# Patient Record
Sex: Male | Born: 1945 | Race: Black or African American | Hispanic: No | Marital: Single | Smoking: Former smoker
Health system: Southern US, Community
[De-identification: ages and names within clinical notes are randomized; demographics above are authoritative.]

## PROBLEM LIST (undated history)

## (undated) DIAGNOSIS — I872 Venous insufficiency (chronic) (peripheral): Secondary | ICD-10-CM

## (undated) DIAGNOSIS — G709 Myoneural disorder, unspecified: Secondary | ICD-10-CM

## (undated) DIAGNOSIS — E119 Type 2 diabetes mellitus without complications: Secondary | ICD-10-CM

## (undated) DIAGNOSIS — H269 Unspecified cataract: Secondary | ICD-10-CM

## (undated) DIAGNOSIS — E785 Hyperlipidemia, unspecified: Secondary | ICD-10-CM

## (undated) HISTORY — DX: Unspecified cataract: H26.9

## (undated) HISTORY — DX: Venous insufficiency (chronic) (peripheral): I87.2

## (undated) HISTORY — DX: Myoneural disorder, unspecified: G70.9

## (undated) HISTORY — DX: Hyperlipidemia, unspecified: E78.5

## (undated) HISTORY — DX: Type 2 diabetes mellitus without complications: E11.9

## (undated) HISTORY — PX: CARDIAC CATHETERIZATION: SHX172

---

## 1974-04-14 HISTORY — PX: INGUINAL HERNIA REPAIR: SUR1180

## 2001-02-05 ENCOUNTER — Ambulatory Visit (HOSPITAL_COMMUNITY): Admission: RE | Admit: 2001-02-05 | Discharge: 2001-02-05 | Payer: Self-pay | Admitting: *Deleted

## 2011-09-17 DIAGNOSIS — E119 Type 2 diabetes mellitus without complications: Secondary | ICD-10-CM | POA: Diagnosis not present

## 2011-09-17 DIAGNOSIS — R6882 Decreased libido: Secondary | ICD-10-CM | POA: Diagnosis not present

## 2011-11-05 DIAGNOSIS — E785 Hyperlipidemia, unspecified: Secondary | ICD-10-CM | POA: Diagnosis not present

## 2011-11-05 DIAGNOSIS — E291 Testicular hypofunction: Secondary | ICD-10-CM | POA: Diagnosis not present

## 2011-11-05 DIAGNOSIS — E119 Type 2 diabetes mellitus without complications: Secondary | ICD-10-CM | POA: Diagnosis not present

## 2011-11-05 DIAGNOSIS — R6882 Decreased libido: Secondary | ICD-10-CM | POA: Diagnosis not present

## 2011-12-16 DIAGNOSIS — E119 Type 2 diabetes mellitus without complications: Secondary | ICD-10-CM | POA: Diagnosis not present

## 2011-12-16 DIAGNOSIS — L259 Unspecified contact dermatitis, unspecified cause: Secondary | ICD-10-CM | POA: Diagnosis not present

## 2011-12-16 DIAGNOSIS — E291 Testicular hypofunction: Secondary | ICD-10-CM | POA: Diagnosis not present

## 2012-03-30 DIAGNOSIS — Z23 Encounter for immunization: Secondary | ICD-10-CM | POA: Diagnosis not present

## 2012-03-30 DIAGNOSIS — E119 Type 2 diabetes mellitus without complications: Secondary | ICD-10-CM | POA: Diagnosis not present

## 2012-03-30 DIAGNOSIS — R609 Edema, unspecified: Secondary | ICD-10-CM | POA: Diagnosis not present

## 2012-03-30 DIAGNOSIS — E785 Hyperlipidemia, unspecified: Secondary | ICD-10-CM | POA: Diagnosis not present

## 2012-03-30 DIAGNOSIS — E291 Testicular hypofunction: Secondary | ICD-10-CM | POA: Diagnosis not present

## 2012-04-20 DIAGNOSIS — E291 Testicular hypofunction: Secondary | ICD-10-CM | POA: Diagnosis not present

## 2012-10-11 DIAGNOSIS — I872 Venous insufficiency (chronic) (peripheral): Secondary | ICD-10-CM | POA: Insufficient documentation

## 2012-10-11 DIAGNOSIS — E119 Type 2 diabetes mellitus without complications: Secondary | ICD-10-CM | POA: Diagnosis not present

## 2012-12-16 DIAGNOSIS — Z23 Encounter for immunization: Secondary | ICD-10-CM | POA: Diagnosis not present

## 2013-01-28 DIAGNOSIS — E291 Testicular hypofunction: Secondary | ICD-10-CM | POA: Diagnosis not present

## 2013-01-28 DIAGNOSIS — K5900 Constipation, unspecified: Secondary | ICD-10-CM | POA: Insufficient documentation

## 2013-01-28 DIAGNOSIS — I872 Venous insufficiency (chronic) (peripheral): Secondary | ICD-10-CM | POA: Diagnosis not present

## 2013-01-28 DIAGNOSIS — Z6831 Body mass index (BMI) 31.0-31.9, adult: Secondary | ICD-10-CM | POA: Diagnosis not present

## 2013-01-28 DIAGNOSIS — E1165 Type 2 diabetes mellitus with hyperglycemia: Secondary | ICD-10-CM | POA: Diagnosis not present

## 2013-01-28 DIAGNOSIS — Z1331 Encounter for screening for depression: Secondary | ICD-10-CM | POA: Diagnosis not present

## 2013-01-28 DIAGNOSIS — E785 Hyperlipidemia, unspecified: Secondary | ICD-10-CM | POA: Diagnosis not present

## 2013-02-25 ENCOUNTER — Encounter: Payer: Self-pay | Admitting: Cardiology

## 2013-06-15 DIAGNOSIS — E291 Testicular hypofunction: Secondary | ICD-10-CM | POA: Diagnosis not present

## 2013-06-15 DIAGNOSIS — Z6832 Body mass index (BMI) 32.0-32.9, adult: Secondary | ICD-10-CM | POA: Diagnosis not present

## 2013-06-15 DIAGNOSIS — K5900 Constipation, unspecified: Secondary | ICD-10-CM | POA: Diagnosis not present

## 2013-06-15 DIAGNOSIS — E785 Hyperlipidemia, unspecified: Secondary | ICD-10-CM | POA: Diagnosis not present

## 2013-06-15 DIAGNOSIS — E1165 Type 2 diabetes mellitus with hyperglycemia: Secondary | ICD-10-CM | POA: Diagnosis not present

## 2013-06-15 DIAGNOSIS — IMO0002 Reserved for concepts with insufficient information to code with codable children: Secondary | ICD-10-CM | POA: Diagnosis not present

## 2013-08-10 DIAGNOSIS — E291 Testicular hypofunction: Secondary | ICD-10-CM | POA: Diagnosis not present

## 2013-08-10 DIAGNOSIS — E785 Hyperlipidemia, unspecified: Secondary | ICD-10-CM | POA: Diagnosis not present

## 2013-08-10 DIAGNOSIS — IMO0002 Reserved for concepts with insufficient information to code with codable children: Secondary | ICD-10-CM | POA: Diagnosis not present

## 2013-08-10 DIAGNOSIS — Z6831 Body mass index (BMI) 31.0-31.9, adult: Secondary | ICD-10-CM | POA: Diagnosis not present

## 2013-08-10 DIAGNOSIS — E1165 Type 2 diabetes mellitus with hyperglycemia: Secondary | ICD-10-CM | POA: Diagnosis not present

## 2013-10-05 DIAGNOSIS — E1165 Type 2 diabetes mellitus with hyperglycemia: Secondary | ICD-10-CM | POA: Diagnosis not present

## 2013-10-05 DIAGNOSIS — I872 Venous insufficiency (chronic) (peripheral): Secondary | ICD-10-CM | POA: Diagnosis not present

## 2013-10-05 DIAGNOSIS — Z6831 Body mass index (BMI) 31.0-31.9, adult: Secondary | ICD-10-CM | POA: Diagnosis not present

## 2013-10-05 DIAGNOSIS — IMO0001 Reserved for inherently not codable concepts without codable children: Secondary | ICD-10-CM | POA: Diagnosis not present

## 2013-10-05 DIAGNOSIS — IMO0002 Reserved for concepts with insufficient information to code with codable children: Secondary | ICD-10-CM | POA: Diagnosis not present

## 2013-10-05 DIAGNOSIS — E785 Hyperlipidemia, unspecified: Secondary | ICD-10-CM | POA: Diagnosis not present

## 2013-10-05 DIAGNOSIS — E291 Testicular hypofunction: Secondary | ICD-10-CM | POA: Diagnosis not present

## 2013-11-03 DIAGNOSIS — Z6832 Body mass index (BMI) 32.0-32.9, adult: Secondary | ICD-10-CM | POA: Diagnosis not present

## 2013-11-03 DIAGNOSIS — IMO0001 Reserved for inherently not codable concepts without codable children: Secondary | ICD-10-CM | POA: Diagnosis not present

## 2013-11-16 DIAGNOSIS — Z6831 Body mass index (BMI) 31.0-31.9, adult: Secondary | ICD-10-CM | POA: Diagnosis not present

## 2013-11-16 DIAGNOSIS — E291 Testicular hypofunction: Secondary | ICD-10-CM | POA: Diagnosis not present

## 2013-11-16 DIAGNOSIS — IMO0001 Reserved for inherently not codable concepts without codable children: Secondary | ICD-10-CM | POA: Diagnosis not present

## 2013-11-16 DIAGNOSIS — E785 Hyperlipidemia, unspecified: Secondary | ICD-10-CM | POA: Diagnosis not present

## 2013-11-16 DIAGNOSIS — I872 Venous insufficiency (chronic) (peripheral): Secondary | ICD-10-CM | POA: Diagnosis not present

## 2013-12-14 DIAGNOSIS — Z6833 Body mass index (BMI) 33.0-33.9, adult: Secondary | ICD-10-CM | POA: Diagnosis not present

## 2013-12-14 DIAGNOSIS — IMO0001 Reserved for inherently not codable concepts without codable children: Secondary | ICD-10-CM | POA: Diagnosis not present

## 2014-01-11 DIAGNOSIS — I872 Venous insufficiency (chronic) (peripheral): Secondary | ICD-10-CM | POA: Diagnosis not present

## 2014-01-11 DIAGNOSIS — IMO0001 Reserved for inherently not codable concepts without codable children: Secondary | ICD-10-CM | POA: Diagnosis not present

## 2014-01-11 DIAGNOSIS — Z23 Encounter for immunization: Secondary | ICD-10-CM | POA: Diagnosis not present

## 2014-01-11 DIAGNOSIS — Z6833 Body mass index (BMI) 33.0-33.9, adult: Secondary | ICD-10-CM | POA: Diagnosis not present

## 2014-01-11 DIAGNOSIS — E291 Testicular hypofunction: Secondary | ICD-10-CM | POA: Diagnosis not present

## 2014-01-11 DIAGNOSIS — E785 Hyperlipidemia, unspecified: Secondary | ICD-10-CM | POA: Diagnosis not present

## 2014-02-08 DIAGNOSIS — E1165 Type 2 diabetes mellitus with hyperglycemia: Secondary | ICD-10-CM | POA: Diagnosis not present

## 2014-02-08 DIAGNOSIS — Z6833 Body mass index (BMI) 33.0-33.9, adult: Secondary | ICD-10-CM | POA: Diagnosis not present

## 2014-05-03 DIAGNOSIS — Z6833 Body mass index (BMI) 33.0-33.9, adult: Secondary | ICD-10-CM | POA: Diagnosis not present

## 2014-05-03 DIAGNOSIS — E785 Hyperlipidemia, unspecified: Secondary | ICD-10-CM | POA: Diagnosis not present

## 2014-05-03 DIAGNOSIS — E119 Type 2 diabetes mellitus without complications: Secondary | ICD-10-CM | POA: Diagnosis not present

## 2014-05-03 DIAGNOSIS — I872 Venous insufficiency (chronic) (peripheral): Secondary | ICD-10-CM | POA: Diagnosis not present

## 2014-05-03 DIAGNOSIS — E291 Testicular hypofunction: Secondary | ICD-10-CM | POA: Diagnosis not present

## 2014-05-03 DIAGNOSIS — K5900 Constipation, unspecified: Secondary | ICD-10-CM | POA: Diagnosis not present

## 2014-07-04 DIAGNOSIS — Z6833 Body mass index (BMI) 33.0-33.9, adult: Secondary | ICD-10-CM | POA: Diagnosis not present

## 2014-07-04 DIAGNOSIS — E119 Type 2 diabetes mellitus without complications: Secondary | ICD-10-CM | POA: Diagnosis not present

## 2014-09-05 DIAGNOSIS — E785 Hyperlipidemia, unspecified: Secondary | ICD-10-CM | POA: Diagnosis not present

## 2014-09-05 DIAGNOSIS — E119 Type 2 diabetes mellitus without complications: Secondary | ICD-10-CM | POA: Diagnosis not present

## 2014-09-05 DIAGNOSIS — R600 Localized edema: Secondary | ICD-10-CM | POA: Diagnosis not present

## 2014-09-05 DIAGNOSIS — Z6834 Body mass index (BMI) 34.0-34.9, adult: Secondary | ICD-10-CM | POA: Diagnosis not present

## 2014-09-05 DIAGNOSIS — E291 Testicular hypofunction: Secondary | ICD-10-CM | POA: Diagnosis not present

## 2015-01-15 DIAGNOSIS — E291 Testicular hypofunction: Secondary | ICD-10-CM | POA: Diagnosis not present

## 2015-01-15 DIAGNOSIS — I872 Venous insufficiency (chronic) (peripheral): Secondary | ICD-10-CM | POA: Diagnosis not present

## 2015-01-15 DIAGNOSIS — E785 Hyperlipidemia, unspecified: Secondary | ICD-10-CM | POA: Diagnosis not present

## 2015-01-15 DIAGNOSIS — Z6834 Body mass index (BMI) 34.0-34.9, adult: Secondary | ICD-10-CM | POA: Diagnosis not present

## 2015-01-15 DIAGNOSIS — E119 Type 2 diabetes mellitus without complications: Secondary | ICD-10-CM | POA: Diagnosis not present

## 2015-01-15 DIAGNOSIS — Z23 Encounter for immunization: Secondary | ICD-10-CM | POA: Diagnosis not present

## 2015-05-28 DIAGNOSIS — I872 Venous insufficiency (chronic) (peripheral): Secondary | ICD-10-CM | POA: Diagnosis not present

## 2015-05-28 DIAGNOSIS — Z6832 Body mass index (BMI) 32.0-32.9, adult: Secondary | ICD-10-CM | POA: Diagnosis not present

## 2015-05-28 DIAGNOSIS — Z1389 Encounter for screening for other disorder: Secondary | ICD-10-CM | POA: Diagnosis not present

## 2015-05-28 DIAGNOSIS — E784 Other hyperlipidemia: Secondary | ICD-10-CM | POA: Diagnosis not present

## 2015-05-28 DIAGNOSIS — E119 Type 2 diabetes mellitus without complications: Secondary | ICD-10-CM | POA: Diagnosis not present

## 2015-05-28 DIAGNOSIS — E298 Other testicular dysfunction: Secondary | ICD-10-CM | POA: Diagnosis not present

## 2015-06-01 ENCOUNTER — Encounter: Payer: Self-pay | Admitting: Gastroenterology

## 2015-07-19 DIAGNOSIS — H2513 Age-related nuclear cataract, bilateral: Secondary | ICD-10-CM | POA: Diagnosis not present

## 2015-07-19 DIAGNOSIS — H40033 Anatomical narrow angle, bilateral: Secondary | ICD-10-CM | POA: Diagnosis not present

## 2015-08-03 ENCOUNTER — Encounter: Payer: Self-pay | Admitting: Gastroenterology

## 2015-08-27 ENCOUNTER — Ambulatory Visit (AMBULATORY_SURGERY_CENTER): Payer: Self-pay | Admitting: *Deleted

## 2015-08-27 VITALS — Ht 73.0 in | Wt 232.8 lb

## 2015-08-27 DIAGNOSIS — Z1211 Encounter for screening for malignant neoplasm of colon: Secondary | ICD-10-CM

## 2015-08-27 NOTE — Progress Notes (Signed)
No egg or soy allergy known to patient  No issues with past sedation with any surgeries  or procedures, no intubation problems  No diet pills per patient No home 02 use per patient  No blood thinners per patient  Pt denies issues with constipation - pt states he has occasionally  had to take a stool softener for constipation but not weekly or chronic

## 2015-09-07 ENCOUNTER — Ambulatory Visit (AMBULATORY_SURGERY_CENTER): Payer: Medicare Other | Admitting: Gastroenterology

## 2015-09-07 ENCOUNTER — Encounter: Payer: Self-pay | Admitting: Gastroenterology

## 2015-09-07 VITALS — BP 110/63 | HR 55 | Temp 98.0°F | Resp 15 | Ht 73.0 in | Wt 232.0 lb

## 2015-09-07 DIAGNOSIS — Z1211 Encounter for screening for malignant neoplasm of colon: Secondary | ICD-10-CM | POA: Diagnosis not present

## 2015-09-07 DIAGNOSIS — Z122 Encounter for screening for malignant neoplasm of respiratory organs: Secondary | ICD-10-CM

## 2015-09-07 DIAGNOSIS — N319 Neuromuscular dysfunction of bladder, unspecified: Secondary | ICD-10-CM | POA: Diagnosis not present

## 2015-09-07 DIAGNOSIS — E119 Type 2 diabetes mellitus without complications: Secondary | ICD-10-CM | POA: Diagnosis not present

## 2015-09-07 DIAGNOSIS — K648 Other hemorrhoids: Secondary | ICD-10-CM | POA: Diagnosis not present

## 2015-09-07 MED ORDER — SODIUM CHLORIDE 0.9 % IV SOLN: 500.0000 mL | INTRAVENOUS | Status: DC

## 2015-09-07 NOTE — Progress Notes (Signed)
Stable to RR 

## 2015-09-07 NOTE — Op Note (Signed)
[facility] Patient Name: Derrick Ward Procedure Date: 09/07/2015 9:29 AM MRN: [id number] Endoscopist: Sherilyn Cooter L. Myrtie Neither , MD Age: 70 Referring MD:  Date of Birth: May 30, 1945 Gender: Male Procedure:                Colonoscopy Indications:              Screening for colorectal malignant neoplasm, This                            is the patient's first colonoscopy Medicines:                Monitored Anesthesia Care Procedure:                Pre-Anesthesia Assessment:                           - Prior to the procedure, a History and Physical                            was performed, and patient medications and                            allergies were reviewed. The patient's tolerance of                            previous anesthesia was also reviewed. The risks                            and benefits of the procedure and the sedation                            options and risks were discussed with the patient.                            All questions were answered, and informed consent                            was obtained. Prior Anticoagulants: The patient has                            taken no previous anticoagulant or antiplatelet                            agents. ASA Grade Assessment: II - A patient with                            mild systemic disease. After reviewing the risks                            and benefits, the patient was deemed in                            satisfactory condition to undergo the procedure.  After obtaining informed consent, the colonoscope                            was passed under direct vision. Throughout the                            procedure, the patient's blood pressure, pulse, and                            oxygen saturations were monitored continuously. The                            Model CF-HQ190L [id number]) scope was introduced                            through the anus and advanced to the the cecum,                             identified by appendiceal orifice and ileocecal                            valve. The colonoscopy was performed without                            difficulty. The patient tolerated the procedure                            well. The quality of the bowel preparation was                            good. The ileocecal valve, appendiceal orifice, and                            rectum were photographed. Scope In: 9:36:00 AM Scope Out: 9:53:12 AM Scope Withdrawal Time: 0 hours 10 minutes 19 seconds  Total Procedure Duration: 0 hours 17 minutes 12 seconds  Findings:                 The perianal and digital rectal examinations were                            normal.                           Internal hemorrhoids were found during                            retroflexion. The hemorrhoids were Grade I                            (internal hemorrhoids that do not prolapse).                           The exam was otherwise without abnormality. Complications:            No immediate complications. Estimated  Blood Loss:     Estimated blood loss: none. Impression:               - Internal hemorrhoids.                           - The examination was otherwise normal.                           - No specimens collected. Recommendation:           - Patient has a contact number available for                            emergencies. The signs and symptoms of potential                            delayed complications were discussed with the                            patient. Return to normal activities tomorrow.                            Written discharge instructions were provided to the                            patient.                           - Resume previous diet.                           - Continue present medications.                           - Repeat colonoscopy in 10 years for screening                            purposes if patient's overall health allows. Kelby Lotspeich L.  Myrtie Neither, MD 09/07/2015 9:58:57 AM This report has been signed electronically.

## 2015-09-07 NOTE — Patient Instructions (Signed)
YOU HAD AN ENDOSCOPIC PROCEDURE TODAY AT THE [facility]:   Refer to the procedure report that was given to you for any specific questions about what was found during the examination.  If the procedure report does not answer your questions, please call your gastroenterologist to clarify.  If you requested that your care partner not be given the details of your procedure findings, then the procedure report has been included in a sealed envelope for you to review at your convenience later.  YOU SHOULD EXPECT: Some feelings of bloating in the abdomen. Passage of more gas than usual.  Walking can help get rid of the air that was put into your GI tract during the procedure and reduce the bloating. If you had a lower endoscopy (such as a colonoscopy or flexible sigmoidoscopy) you may notice spotting of blood in your stool or on the toilet paper. If you underwent a bowel prep for your procedure, you may not have a normal bowel movement for a few days.  Please Note:  You might notice some irritation and congestion in your nose or some drainage.  This is from the oxygen used during your procedure.  There is no need for concern and it should clear up in a day or so.  SYMPTOMS TO REPORT IMMEDIATELY:   Following lower endoscopy (colonoscopy or flexible sigmoidoscopy):  Excessive amounts of blood in the stool  Significant tenderness or worsening of abdominal pains  Swelling of the abdomen that is new, acute  Fever of 100F or higher   For urgent or emergent issues, a gastroenterologist can be reached at any hour by calling [phone].   DIET: Your first meal following the procedure should be a small meal and then it is ok to progress to your normal diet. Heavy or fried foods are harder to digest and may make you feel nauseous or bloated.  Likewise, meals heavy in dairy and vegetables can increase bloating.  Drink plenty of fluids but you should avoid alcoholic beverages for 24  hours.  ACTIVITY:  You should plan to take it easy for the rest of today and you should NOT DRIVE or use heavy machinery until tomorrow (because of the sedation medicines used during the test).    FOLLOW UP: Our staff will call the number listed on your records the next business day following your procedure to check on you and address any questions or concerns that you may have regarding the information given to you following your procedure. If we do not reach you, we will leave a message.  However, if you are feeling well and you are not experiencing any problems, there is no need to return our call.  We will assume that you have returned to your regular daily activities without incident.  If any biopsies were taken you will be contacted by phone or by letter within the next 1-3 weeks.  Please call us at [phone] if you have not heard about the biopsies in 3 weeks.    SIGNATURES/CONFIDENTIALITY: You and/or your care partner have signed paperwork which will be entered into your electronic medical record.  These signatures attest to the fact that that the information above on your After Visit Summary has been reviewed and is understood.  Full responsibility of the confidentiality of this discharge information lies with you and/or your care-partner.  Hemorrhoids-handouts given  Repeat colonoscopy in 10 years 2027.

## 2015-09-11 ENCOUNTER — Telehealth: Payer: Self-pay

## 2015-09-11 NOTE — Telephone Encounter (Signed)
  Follow up Call-  Call back number 09/07/2015  Post procedure Call Back phone  # [phone]  Permission to leave phone message Yes     Patient questions:  Do you have a fever, pain , or abdominal swelling? No. Pain Score  0 *  Have you tolerated food without any problems? Yes.    Have you been able to return to your normal activities? Yes.    Do you have any questions about your discharge instructions: Diet   No. Medications  No. Follow up visit  No.  Do you have questions or concerns about your Care? No.  Actions: * If pain score is 4 or above: No action needed, pain <4.

## 2015-10-08 DIAGNOSIS — E298 Other testicular dysfunction: Secondary | ICD-10-CM | POA: Diagnosis not present

## 2015-10-08 DIAGNOSIS — I872 Venous insufficiency (chronic) (peripheral): Secondary | ICD-10-CM | POA: Diagnosis not present

## 2015-10-08 DIAGNOSIS — E784 Other hyperlipidemia: Secondary | ICD-10-CM | POA: Diagnosis not present

## 2015-10-08 DIAGNOSIS — Z1389 Encounter for screening for other disorder: Secondary | ICD-10-CM | POA: Diagnosis not present

## 2015-10-08 DIAGNOSIS — E119 Type 2 diabetes mellitus without complications: Secondary | ICD-10-CM | POA: Diagnosis not present

## 2015-10-08 DIAGNOSIS — Z6832 Body mass index (BMI) 32.0-32.9, adult: Secondary | ICD-10-CM | POA: Diagnosis not present

## 2015-11-08 DIAGNOSIS — Z6832 Body mass index (BMI) 32.0-32.9, adult: Secondary | ICD-10-CM | POA: Diagnosis not present

## 2015-11-08 DIAGNOSIS — E119 Type 2 diabetes mellitus without complications: Secondary | ICD-10-CM | POA: Diagnosis not present

## 2016-02-12 DIAGNOSIS — E784 Other hyperlipidemia: Secondary | ICD-10-CM | POA: Diagnosis not present

## 2016-02-12 DIAGNOSIS — Z6832 Body mass index (BMI) 32.0-32.9, adult: Secondary | ICD-10-CM | POA: Diagnosis not present

## 2016-02-12 DIAGNOSIS — I872 Venous insufficiency (chronic) (peripheral): Secondary | ICD-10-CM | POA: Diagnosis not present

## 2016-02-12 DIAGNOSIS — E291 Testicular hypofunction: Secondary | ICD-10-CM | POA: Diagnosis not present

## 2016-02-12 DIAGNOSIS — Z23 Encounter for immunization: Secondary | ICD-10-CM | POA: Diagnosis not present

## 2016-02-12 DIAGNOSIS — E119 Type 2 diabetes mellitus without complications: Secondary | ICD-10-CM | POA: Diagnosis not present

## 2016-04-30 DIAGNOSIS — Z6832 Body mass index (BMI) 32.0-32.9, adult: Secondary | ICD-10-CM | POA: Diagnosis not present

## 2016-04-30 DIAGNOSIS — E784 Other hyperlipidemia: Secondary | ICD-10-CM | POA: Diagnosis not present

## 2016-04-30 DIAGNOSIS — R600 Localized edema: Secondary | ICD-10-CM | POA: Diagnosis not present

## 2016-04-30 DIAGNOSIS — E119 Type 2 diabetes mellitus without complications: Secondary | ICD-10-CM | POA: Diagnosis not present

## 2016-07-09 DIAGNOSIS — Z1389 Encounter for screening for other disorder: Secondary | ICD-10-CM | POA: Diagnosis not present

## 2016-07-09 DIAGNOSIS — I872 Venous insufficiency (chronic) (peripheral): Secondary | ICD-10-CM | POA: Diagnosis not present

## 2016-07-09 DIAGNOSIS — E119 Type 2 diabetes mellitus without complications: Secondary | ICD-10-CM | POA: Diagnosis not present

## 2016-07-09 DIAGNOSIS — Z6832 Body mass index (BMI) 32.0-32.9, adult: Secondary | ICD-10-CM | POA: Diagnosis not present

## 2016-07-09 DIAGNOSIS — E784 Other hyperlipidemia: Secondary | ICD-10-CM | POA: Diagnosis not present

## 2016-10-03 DIAGNOSIS — E784 Other hyperlipidemia: Secondary | ICD-10-CM | POA: Diagnosis not present

## 2016-10-03 DIAGNOSIS — E119 Type 2 diabetes mellitus without complications: Secondary | ICD-10-CM | POA: Diagnosis not present

## 2016-10-03 DIAGNOSIS — Z6831 Body mass index (BMI) 31.0-31.9, adult: Secondary | ICD-10-CM | POA: Diagnosis not present

## 2016-10-03 DIAGNOSIS — K5909 Other constipation: Secondary | ICD-10-CM | POA: Diagnosis not present

## 2016-10-03 DIAGNOSIS — R600 Localized edema: Secondary | ICD-10-CM | POA: Diagnosis not present

## 2016-12-26 DIAGNOSIS — Z6831 Body mass index (BMI) 31.0-31.9, adult: Secondary | ICD-10-CM | POA: Diagnosis not present

## 2016-12-26 DIAGNOSIS — I872 Venous insufficiency (chronic) (peripheral): Secondary | ICD-10-CM | POA: Diagnosis not present

## 2016-12-26 DIAGNOSIS — Z1389 Encounter for screening for other disorder: Secondary | ICD-10-CM | POA: Diagnosis not present

## 2016-12-26 DIAGNOSIS — E784 Other hyperlipidemia: Secondary | ICD-10-CM | POA: Diagnosis not present

## 2016-12-26 DIAGNOSIS — E119 Type 2 diabetes mellitus without complications: Secondary | ICD-10-CM | POA: Diagnosis not present

## 2016-12-26 DIAGNOSIS — Z23 Encounter for immunization: Secondary | ICD-10-CM | POA: Diagnosis not present

## 2017-04-05 DIAGNOSIS — H40033 Anatomical narrow angle, bilateral: Secondary | ICD-10-CM | POA: Diagnosis not present

## 2017-04-05 DIAGNOSIS — E119 Type 2 diabetes mellitus without complications: Secondary | ICD-10-CM | POA: Diagnosis not present

## 2017-05-12 DIAGNOSIS — E7849 Other hyperlipidemia: Secondary | ICD-10-CM | POA: Diagnosis not present

## 2017-05-12 DIAGNOSIS — Z1389 Encounter for screening for other disorder: Secondary | ICD-10-CM | POA: Diagnosis not present

## 2017-05-12 DIAGNOSIS — Z794 Long term (current) use of insulin: Secondary | ICD-10-CM | POA: Insufficient documentation

## 2017-05-12 DIAGNOSIS — K5900 Constipation, unspecified: Secondary | ICD-10-CM | POA: Diagnosis not present

## 2017-05-12 DIAGNOSIS — E119 Type 2 diabetes mellitus without complications: Secondary | ICD-10-CM | POA: Diagnosis not present

## 2017-05-12 DIAGNOSIS — I872 Venous insufficiency (chronic) (peripheral): Secondary | ICD-10-CM | POA: Diagnosis not present

## 2017-05-12 DIAGNOSIS — Z6833 Body mass index (BMI) 33.0-33.9, adult: Secondary | ICD-10-CM | POA: Diagnosis not present

## 2017-09-09 DIAGNOSIS — E1169 Type 2 diabetes mellitus with other specified complication: Secondary | ICD-10-CM | POA: Diagnosis not present

## 2017-09-09 DIAGNOSIS — Z6832 Body mass index (BMI) 32.0-32.9, adult: Secondary | ICD-10-CM | POA: Diagnosis not present

## 2017-09-09 DIAGNOSIS — E7849 Other hyperlipidemia: Secondary | ICD-10-CM | POA: Diagnosis not present

## 2017-09-09 DIAGNOSIS — Z794 Long term (current) use of insulin: Secondary | ICD-10-CM | POA: Diagnosis not present

## 2017-09-09 DIAGNOSIS — I872 Venous insufficiency (chronic) (peripheral): Secondary | ICD-10-CM | POA: Diagnosis not present

## 2017-09-09 DIAGNOSIS — E1142 Type 2 diabetes mellitus with diabetic polyneuropathy: Secondary | ICD-10-CM | POA: Diagnosis not present

## 2017-11-17 DIAGNOSIS — E119 Type 2 diabetes mellitus without complications: Secondary | ICD-10-CM | POA: Diagnosis not present

## 2017-11-17 DIAGNOSIS — H40033 Anatomical narrow angle, bilateral: Secondary | ICD-10-CM | POA: Diagnosis not present

## 2018-02-03 DIAGNOSIS — Z23 Encounter for immunization: Secondary | ICD-10-CM | POA: Diagnosis not present

## 2018-02-03 DIAGNOSIS — R361 Hematospermia: Secondary | ICD-10-CM | POA: Diagnosis not present

## 2018-02-03 DIAGNOSIS — E1149 Type 2 diabetes mellitus with other diabetic neurological complication: Secondary | ICD-10-CM | POA: Diagnosis not present

## 2018-02-03 DIAGNOSIS — E7849 Other hyperlipidemia: Secondary | ICD-10-CM | POA: Diagnosis not present

## 2018-02-03 DIAGNOSIS — E291 Testicular hypofunction: Secondary | ICD-10-CM | POA: Diagnosis not present

## 2018-02-03 DIAGNOSIS — Z6833 Body mass index (BMI) 33.0-33.9, adult: Secondary | ICD-10-CM | POA: Diagnosis not present

## 2018-02-03 DIAGNOSIS — Z794 Long term (current) use of insulin: Secondary | ICD-10-CM | POA: Diagnosis not present

## 2018-02-03 DIAGNOSIS — R600 Localized edema: Secondary | ICD-10-CM | POA: Diagnosis not present

## 2018-06-03 DIAGNOSIS — E1149 Type 2 diabetes mellitus with other diabetic neurological complication: Secondary | ICD-10-CM | POA: Diagnosis not present

## 2018-06-03 DIAGNOSIS — Z794 Long term (current) use of insulin: Secondary | ICD-10-CM | POA: Diagnosis not present

## 2018-06-03 DIAGNOSIS — E1142 Type 2 diabetes mellitus with diabetic polyneuropathy: Secondary | ICD-10-CM | POA: Diagnosis not present

## 2018-06-03 DIAGNOSIS — E7849 Other hyperlipidemia: Secondary | ICD-10-CM | POA: Diagnosis not present

## 2018-10-11 DIAGNOSIS — N401 Enlarged prostate with lower urinary tract symptoms: Secondary | ICD-10-CM | POA: Diagnosis not present

## 2018-10-11 DIAGNOSIS — E785 Hyperlipidemia, unspecified: Secondary | ICD-10-CM | POA: Diagnosis not present

## 2018-10-11 DIAGNOSIS — E291 Testicular hypofunction: Secondary | ICD-10-CM | POA: Diagnosis not present

## 2018-10-11 DIAGNOSIS — E7849 Other hyperlipidemia: Secondary | ICD-10-CM | POA: Diagnosis not present

## 2018-10-11 DIAGNOSIS — I872 Venous insufficiency (chronic) (peripheral): Secondary | ICD-10-CM | POA: Diagnosis not present

## 2018-10-11 DIAGNOSIS — E1149 Type 2 diabetes mellitus with other diabetic neurological complication: Secondary | ICD-10-CM | POA: Diagnosis not present

## 2018-10-11 DIAGNOSIS — Z794 Long term (current) use of insulin: Secondary | ICD-10-CM | POA: Diagnosis not present

## 2018-10-11 DIAGNOSIS — E1142 Type 2 diabetes mellitus with diabetic polyneuropathy: Secondary | ICD-10-CM | POA: Diagnosis not present

## 2018-10-12 DIAGNOSIS — E119 Type 2 diabetes mellitus without complications: Secondary | ICD-10-CM | POA: Diagnosis not present

## 2018-10-12 DIAGNOSIS — H40033 Anatomical narrow angle, bilateral: Secondary | ICD-10-CM | POA: Diagnosis not present

## 2018-11-22 DIAGNOSIS — H52223 Regular astigmatism, bilateral: Secondary | ICD-10-CM | POA: Diagnosis not present

## 2018-11-22 DIAGNOSIS — H524 Presbyopia: Secondary | ICD-10-CM | POA: Diagnosis not present

## 2019-01-15 DIAGNOSIS — Z23 Encounter for immunization: Secondary | ICD-10-CM | POA: Diagnosis not present

## 2019-02-10 DIAGNOSIS — E1142 Type 2 diabetes mellitus with diabetic polyneuropathy: Secondary | ICD-10-CM | POA: Diagnosis not present

## 2019-02-10 DIAGNOSIS — E1149 Type 2 diabetes mellitus with other diabetic neurological complication: Secondary | ICD-10-CM | POA: Diagnosis not present

## 2019-02-10 DIAGNOSIS — M67442 Ganglion, left hand: Secondary | ICD-10-CM | POA: Diagnosis not present

## 2019-02-10 DIAGNOSIS — Z0000 Encounter for general adult medical examination without abnormal findings: Secondary | ICD-10-CM | POA: Diagnosis not present

## 2019-02-10 DIAGNOSIS — Z794 Long term (current) use of insulin: Secondary | ICD-10-CM | POA: Diagnosis not present

## 2019-02-10 DIAGNOSIS — E291 Testicular hypofunction: Secondary | ICD-10-CM | POA: Diagnosis not present

## 2019-02-10 DIAGNOSIS — E1151 Type 2 diabetes mellitus with diabetic peripheral angiopathy without gangrene: Secondary | ICD-10-CM | POA: Diagnosis not present

## 2019-02-10 DIAGNOSIS — E785 Hyperlipidemia, unspecified: Secondary | ICD-10-CM | POA: Diagnosis not present

## 2019-02-10 DIAGNOSIS — N401 Enlarged prostate with lower urinary tract symptoms: Secondary | ICD-10-CM | POA: Diagnosis not present

## 2019-02-10 DIAGNOSIS — I872 Venous insufficiency (chronic) (peripheral): Secondary | ICD-10-CM | POA: Diagnosis not present

## 2019-03-14 DIAGNOSIS — E7849 Other hyperlipidemia: Secondary | ICD-10-CM | POA: Diagnosis not present

## 2019-06-06 DIAGNOSIS — Z794 Long term (current) use of insulin: Secondary | ICD-10-CM | POA: Diagnosis not present

## 2019-06-06 DIAGNOSIS — E785 Hyperlipidemia, unspecified: Secondary | ICD-10-CM | POA: Diagnosis not present

## 2019-06-06 DIAGNOSIS — N401 Enlarged prostate with lower urinary tract symptoms: Secondary | ICD-10-CM | POA: Diagnosis not present

## 2019-06-06 DIAGNOSIS — E1142 Type 2 diabetes mellitus with diabetic polyneuropathy: Secondary | ICD-10-CM | POA: Diagnosis not present

## 2019-06-06 DIAGNOSIS — R600 Localized edema: Secondary | ICD-10-CM | POA: Diagnosis not present

## 2019-06-06 DIAGNOSIS — E1149 Type 2 diabetes mellitus with other diabetic neurological complication: Secondary | ICD-10-CM | POA: Diagnosis not present

## 2019-07-07 DIAGNOSIS — Z23 Encounter for immunization: Secondary | ICD-10-CM | POA: Diagnosis not present

## 2019-07-09 DIAGNOSIS — E119 Type 2 diabetes mellitus without complications: Secondary | ICD-10-CM | POA: Diagnosis not present

## 2019-07-09 DIAGNOSIS — H40033 Anatomical narrow angle, bilateral: Secondary | ICD-10-CM | POA: Diagnosis not present

## 2020-05-03 DIAGNOSIS — Z23 Encounter for immunization: Secondary | ICD-10-CM | POA: Diagnosis not present

## 2020-06-12 DIAGNOSIS — E785 Hyperlipidemia, unspecified: Secondary | ICD-10-CM | POA: Diagnosis not present

## 2020-06-12 DIAGNOSIS — M5410 Radiculopathy, site unspecified: Secondary | ICD-10-CM | POA: Diagnosis not present

## 2020-06-12 DIAGNOSIS — I872 Venous insufficiency (chronic) (peripheral): Secondary | ICD-10-CM | POA: Diagnosis not present

## 2020-06-12 DIAGNOSIS — N401 Enlarged prostate with lower urinary tract symptoms: Secondary | ICD-10-CM | POA: Diagnosis not present

## 2020-06-12 DIAGNOSIS — M25669 Stiffness of unspecified knee, not elsewhere classified: Secondary | ICD-10-CM | POA: Diagnosis not present

## 2020-06-12 DIAGNOSIS — K5900 Constipation, unspecified: Secondary | ICD-10-CM | POA: Diagnosis not present

## 2020-06-12 DIAGNOSIS — Z794 Long term (current) use of insulin: Secondary | ICD-10-CM | POA: Diagnosis not present

## 2020-06-12 DIAGNOSIS — E291 Testicular hypofunction: Secondary | ICD-10-CM | POA: Diagnosis not present

## 2020-06-12 DIAGNOSIS — E1142 Type 2 diabetes mellitus with diabetic polyneuropathy: Secondary | ICD-10-CM | POA: Diagnosis not present

## 2020-06-13 DIAGNOSIS — H40033 Anatomical narrow angle, bilateral: Secondary | ICD-10-CM | POA: Diagnosis not present

## 2020-06-13 DIAGNOSIS — E119 Type 2 diabetes mellitus without complications: Secondary | ICD-10-CM | POA: Diagnosis not present

## 2020-10-17 DIAGNOSIS — E559 Vitamin D deficiency, unspecified: Secondary | ICD-10-CM | POA: Diagnosis not present

## 2020-10-17 DIAGNOSIS — N401 Enlarged prostate with lower urinary tract symptoms: Secondary | ICD-10-CM | POA: Diagnosis not present

## 2020-10-17 DIAGNOSIS — E291 Testicular hypofunction: Secondary | ICD-10-CM | POA: Diagnosis not present

## 2020-10-17 DIAGNOSIS — E1142 Type 2 diabetes mellitus with diabetic polyneuropathy: Secondary | ICD-10-CM | POA: Diagnosis not present

## 2020-10-17 DIAGNOSIS — I872 Venous insufficiency (chronic) (peripheral): Secondary | ICD-10-CM | POA: Diagnosis not present

## 2020-10-17 DIAGNOSIS — K5900 Constipation, unspecified: Secondary | ICD-10-CM | POA: Diagnosis not present

## 2020-10-17 DIAGNOSIS — E785 Hyperlipidemia, unspecified: Secondary | ICD-10-CM | POA: Diagnosis not present

## 2020-10-17 DIAGNOSIS — Z794 Long term (current) use of insulin: Secondary | ICD-10-CM | POA: Diagnosis not present

## 2020-10-17 DIAGNOSIS — M5410 Radiculopathy, site unspecified: Secondary | ICD-10-CM | POA: Diagnosis not present

## 2020-11-16 DIAGNOSIS — M5410 Radiculopathy, site unspecified: Secondary | ICD-10-CM | POA: Diagnosis not present

## 2020-11-16 DIAGNOSIS — R200 Anesthesia of skin: Secondary | ICD-10-CM | POA: Diagnosis not present

## 2020-11-16 DIAGNOSIS — M545 Low back pain, unspecified: Secondary | ICD-10-CM | POA: Diagnosis not present

## 2020-11-16 DIAGNOSIS — M4716 Other spondylosis with myelopathy, lumbar region: Secondary | ICD-10-CM | POA: Diagnosis not present

## 2020-11-16 DIAGNOSIS — E1142 Type 2 diabetes mellitus with diabetic polyneuropathy: Secondary | ICD-10-CM | POA: Diagnosis not present

## 2020-11-20 ENCOUNTER — Other Ambulatory Visit: Payer: Self-pay | Admitting: Adult Health

## 2020-11-20 DIAGNOSIS — M5410 Radiculopathy, site unspecified: Secondary | ICD-10-CM

## 2020-11-20 DIAGNOSIS — M545 Low back pain, unspecified: Secondary | ICD-10-CM

## 2020-11-20 DIAGNOSIS — R200 Anesthesia of skin: Secondary | ICD-10-CM

## 2020-11-20 DIAGNOSIS — M4716 Other spondylosis with myelopathy, lumbar region: Secondary | ICD-10-CM

## 2020-11-29 ENCOUNTER — Other Ambulatory Visit: Payer: Self-pay | Admitting: Adult Health

## 2020-11-29 DIAGNOSIS — M5410 Radiculopathy, site unspecified: Secondary | ICD-10-CM

## 2020-11-29 DIAGNOSIS — M545 Low back pain, unspecified: Secondary | ICD-10-CM

## 2020-11-29 DIAGNOSIS — R200 Anesthesia of skin: Secondary | ICD-10-CM

## 2020-11-29 DIAGNOSIS — M4716 Other spondylosis with myelopathy, lumbar region: Secondary | ICD-10-CM

## 2020-12-02 ENCOUNTER — Other Ambulatory Visit: Payer: Self-pay

## 2020-12-02 ENCOUNTER — Ambulatory Visit
Admission: RE | Admit: 2020-12-02 | Discharge: 2020-12-02 | Disposition: A | Discharge status: Home / Self Care | Payer: Self-pay | Source: Ambulatory Visit | Attending: Adult Health | Admitting: Adult Health

## 2020-12-02 DIAGNOSIS — M4716 Other spondylosis with myelopathy, lumbar region: Secondary | ICD-10-CM

## 2020-12-02 DIAGNOSIS — M5410 Radiculopathy, site unspecified: Secondary | ICD-10-CM

## 2020-12-02 DIAGNOSIS — M5127 Other intervertebral disc displacement, lumbosacral region: Secondary | ICD-10-CM | POA: Diagnosis not present

## 2020-12-02 DIAGNOSIS — R109 Unspecified abdominal pain: Secondary | ICD-10-CM | POA: Diagnosis not present

## 2020-12-02 DIAGNOSIS — M48061 Spinal stenosis, lumbar region without neurogenic claudication: Secondary | ICD-10-CM | POA: Diagnosis not present

## 2020-12-02 DIAGNOSIS — M5126 Other intervertebral disc displacement, lumbar region: Secondary | ICD-10-CM | POA: Diagnosis not present

## 2020-12-02 DIAGNOSIS — M545 Low back pain, unspecified: Secondary | ICD-10-CM

## 2020-12-02 DIAGNOSIS — R200 Anesthesia of skin: Secondary | ICD-10-CM

## 2020-12-02 MED ORDER — GADOBENATE DIMEGLUMINE 529 MG/ML IV SOLN
20.0000 mL | Freq: Once | INTRAVENOUS | Status: AC | PRN
  Administered 2020-12-02: 20 mL via INTRAVENOUS

## 2020-12-05 ENCOUNTER — Other Ambulatory Visit: Payer: Self-pay

## 2021-02-02 DIAGNOSIS — Z23 Encounter for immunization: Secondary | ICD-10-CM | POA: Diagnosis not present

## 2021-02-22 DIAGNOSIS — E559 Vitamin D deficiency, unspecified: Secondary | ICD-10-CM | POA: Diagnosis not present

## 2021-02-22 DIAGNOSIS — E291 Testicular hypofunction: Secondary | ICD-10-CM | POA: Diagnosis not present

## 2021-02-22 DIAGNOSIS — Z794 Long term (current) use of insulin: Secondary | ICD-10-CM | POA: Diagnosis not present

## 2021-02-22 DIAGNOSIS — E1142 Type 2 diabetes mellitus with diabetic polyneuropathy: Secondary | ICD-10-CM | POA: Diagnosis not present

## 2021-02-22 DIAGNOSIS — E785 Hyperlipidemia, unspecified: Secondary | ICD-10-CM | POA: Diagnosis not present

## 2021-02-22 DIAGNOSIS — I872 Venous insufficiency (chronic) (peripheral): Secondary | ICD-10-CM | POA: Diagnosis not present

## 2021-02-22 DIAGNOSIS — R200 Anesthesia of skin: Secondary | ICD-10-CM | POA: Diagnosis not present

## 2021-02-22 DIAGNOSIS — N401 Enlarged prostate with lower urinary tract symptoms: Secondary | ICD-10-CM | POA: Diagnosis not present

## 2021-02-22 DIAGNOSIS — M4716 Other spondylosis with myelopathy, lumbar region: Secondary | ICD-10-CM | POA: Diagnosis not present

## 2021-03-01 ENCOUNTER — Other Ambulatory Visit: Payer: Self-pay

## 2021-03-01 ENCOUNTER — Encounter (HOSPITAL_COMMUNITY): Payer: Self-pay

## 2021-03-01 ENCOUNTER — Ambulatory Visit (HOSPITAL_COMMUNITY)
Admission: EM | Admit: 2021-03-01 | Discharge: 2021-03-01 | Disposition: A | Discharge status: Home / Self Care | Payer: Medicare HMO

## 2021-03-01 DIAGNOSIS — M79605 Pain in left leg: Secondary | ICD-10-CM

## 2021-03-01 DIAGNOSIS — M79604 Pain in right leg: Secondary | ICD-10-CM | POA: Diagnosis not present

## 2021-03-01 NOTE — ED Provider Notes (Signed)
[facility]    CSN: [id number] Arrival date & time: 03/01/21  1038      History   Chief Complaint Chief Complaint  Patient presents with   Leg Pain    HPI Derrick Ward is a 75 y.o. male.   HPI  Leg Pain: Pt reports that for the past year he has had bilateral leg pain- worse in the right arm. He does have a history of well controlled DM2, venous insuffiencey and neuromuscular disease. He reports that exercise brings on symptoms. Feels like a muscle fatigue, heaviness. Do significant swelling or skin discoloration. Does have some vasculitides. Has tried compression socks which he thinks help a bit. No history of anemia or recent electrolyte deficiency.    Past Medical History:  Diagnosis Date   Cataract    very small   Diabetes mellitus without complication (HCC)    Hyperlipidemia    Neuromuscular disorder (HCC)    hx neuropathy   Venous insufficiency     There are no problems to display for this patient.   Past Surgical History:  Procedure Laterality Date   CARDIAC CATHETERIZATION     10 yr ago    INGUINAL HERNIA REPAIR  1976       Home Medications    Prior to Admission medications   Medication Sig Start Date End Date Taking? Authorizing Provider  Alpha-D-Galactosidase (BEANO PO) Take by mouth as needed.    [provider]  aspirin 81 MG chewable tablet Chew by mouth daily.    [provider]  Cyanocobalamin (VITAMIN B 12 PO) Take by mouth as needed.    [provider]  insulin detemir (LEVEMIR) 100 UNIT/ML injection Inject 30 Units into the skin at bedtime.    [provider]  Linagliptin-Metformin HCl (JENTADUETO) 2.08-998 MG TABS Take 1 capsule by mouth 2 (two) times daily before a meal.    [provider]  Multiple Vitamin (MULTIVITAMIN) tablet Take 1 tablet by mouth daily.    [provider]    Family History Family History  Problem Relation Age of Onset   Colon cancer Neg Hx     Colon polyps Neg Hx    Diabetes Father     Social History Social History   Tobacco Use   Smoking status: Former   Smokeless tobacco: Never  Substance Use Topics   Alcohol use: No    Alcohol/week: 0.0 standard drinks   Drug use: No     Allergies   Patient has no known allergies.   Review of Systems Review of Systems  As stated above in HPI Physical Exam Triage Vital Signs ED Triage Vitals  Enc Vitals Group     BP 03/01/21 1258 (!) 156/78     Pulse Rate 03/01/21 1258 65     Resp 03/01/21 1258 17     Temp 03/01/21 1258 98.1 F (36.7 C)     Temp Source 03/01/21 1258 Oral     SpO2 03/01/21 1258 99 %     Weight --      Height --      Head Circumference --      Peak Flow --      Pain Score 03/01/21 1257 9     Pain Loc --      Pain Edu? --      Excl. in GC? --    No data found.  Updated Vital Signs BP (!) 156/78 (BP Location: Right Arm)   Pulse 65  Temp 98.1 F (36.7 C) (Oral)   Resp 17   SpO2 99%   Physical Exam Vitals and nursing note reviewed.  Constitutional:      General: He is not in acute distress.    Appearance: Normal appearance. He is not ill-appearing, toxic-appearing or diaphoretic.  Cardiovascular:     Rate and Rhythm: Normal rate and regular rhythm.     Pulses: Normal pulses.     Heart sounds: Normal heart sounds.  Musculoskeletal:     Comments: Negative Homans sign bilaterally   Skin:    General: Skin is warm.     Capillary Refill: Capillary refill takes less than 2 seconds.     Coloration: Skin is not pale.     Findings: No rash.     Comments: Some darker discoloration of the lower legs with hair loss and scattered superficial vascularities. Thickened toenails.   Neurological:     General: No focal deficit present.     Mental Status: He is alert and oriented to person, place, and time.     UC Treatments / Results  Labs (all labs ordered are listed, but only abnormal results are displayed) Labs Reviewed - No data to  display  EKG   Radiology No results found.  Procedures Procedures (including critical care time)  Medications Ordered in UC Medications - No data to display  Initial Impression / Assessment and Plan / UC Course  I have reviewed the triage vital signs and the nursing notes.  Pertinent labs & imaging results that were available during my care of the patient were reviewed by me and considered in my medical decision making (see chart for details).     New.  Likely vascular in nature and not acute.  Discussed this with patient.  I have recommended that he see a vascular specialist for further testing and assessment.  I want him to continue his aspirin and we discussed red flag signs and symptoms.  As he feels that the compression stockings have been beneficial for him he will continue these. Hydration encouraged.    Final Clinical Impressions(s) / UC Diagnoses   Final diagnoses:  None   Discharge Instructions   None    ED Prescriptions   None    PDMP not reviewed this encounter.   Rushie Chestnut, [place] 03/01/21 1332

## 2021-03-01 NOTE — ED Triage Notes (Signed)
Pt reports R leg pain and expresses c/o his leg feeling heavy. States it is hard to stay balanced and walk. Pt states he had an MRI done recently and states there were no concerns. States he has had the leg pain x 1 years and states it has worsened.

## 2021-03-17 ENCOUNTER — Other Ambulatory Visit: Payer: Self-pay

## 2021-03-17 DIAGNOSIS — I872 Venous insufficiency (chronic) (peripheral): Secondary | ICD-10-CM

## 2021-03-22 ENCOUNTER — Ambulatory Visit (HOSPITAL_COMMUNITY)
Admission: RE | Admit: 2021-03-22 | Discharge: 2021-03-22 | Disposition: A | Discharge status: Home / Self Care | Payer: Medicare HMO | Source: Ambulatory Visit | Attending: Physician Assistant | Admitting: Physician Assistant

## 2021-03-22 ENCOUNTER — Ambulatory Visit: Payer: Medicare HMO | Admitting: Physician Assistant

## 2021-03-22 ENCOUNTER — Other Ambulatory Visit: Payer: Self-pay | Admitting: Vascular Surgery

## 2021-03-22 ENCOUNTER — Other Ambulatory Visit: Payer: Self-pay

## 2021-03-22 VITALS — BP 150/79 | HR 62 | Temp 98.3°F | Resp 20 | Ht 73.0 in | Wt 232.0 lb

## 2021-03-22 DIAGNOSIS — R209 Unspecified disturbances of skin sensation: Secondary | ICD-10-CM | POA: Insufficient documentation

## 2021-03-22 DIAGNOSIS — E559 Vitamin D deficiency, unspecified: Secondary | ICD-10-CM | POA: Insufficient documentation

## 2021-03-22 DIAGNOSIS — I872 Venous insufficiency (chronic) (peripheral): Secondary | ICD-10-CM | POA: Insufficient documentation

## 2021-03-22 DIAGNOSIS — M79661 Pain in right lower leg: Secondary | ICD-10-CM

## 2021-03-22 DIAGNOSIS — M7989 Other specified soft tissue disorders: Secondary | ICD-10-CM

## 2021-03-22 DIAGNOSIS — M4716 Other spondylosis with myelopathy, lumbar region: Secondary | ICD-10-CM | POA: Insufficient documentation

## 2021-03-22 DIAGNOSIS — R0989 Other specified symptoms and signs involving the circulatory and respiratory systems: Secondary | ICD-10-CM | POA: Insufficient documentation
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
## 2021-04-11 ENCOUNTER — Other Ambulatory Visit: Payer: Self-pay | Admitting: *Deleted

## 2021-04-11 DIAGNOSIS — M7989 Other specified soft tissue disorders: Secondary | ICD-10-CM

## 2021-04-26 ENCOUNTER — Other Ambulatory Visit: Payer: Self-pay

## 2021-04-26 ENCOUNTER — Ambulatory Visit (HOSPITAL_COMMUNITY)
Admission: RE | Admit: 2021-04-26 | Discharge: 2021-04-26 | Disposition: A | Discharge status: Home / Self Care | Payer: Medicare (Managed Care) | Source: Ambulatory Visit | Attending: Vascular Surgery | Admitting: Vascular Surgery

## 2021-04-26 DIAGNOSIS — M7989 Other specified soft tissue disorders: Secondary | ICD-10-CM

## 2021-04-26 DIAGNOSIS — M79661 Pain in right lower leg: Secondary | ICD-10-CM

## 2021-06-17 DIAGNOSIS — M7989 Other specified soft tissue disorders: Secondary | ICD-10-CM

## 2022-06-20 DIAGNOSIS — E1142 Type 2 diabetes mellitus with diabetic polyneuropathy: Secondary | ICD-10-CM | POA: Diagnosis not present

## 2022-06-20 DIAGNOSIS — M4716 Other spondylosis with myelopathy, lumbar region: Secondary | ICD-10-CM | POA: Diagnosis not present

## 2022-06-20 DIAGNOSIS — E559 Vitamin D deficiency, unspecified: Secondary | ICD-10-CM | POA: Diagnosis not present

## 2022-06-20 DIAGNOSIS — Z794 Long term (current) use of insulin: Secondary | ICD-10-CM | POA: Diagnosis not present

## 2022-06-20 DIAGNOSIS — N401 Enlarged prostate with lower urinary tract symptoms: Secondary | ICD-10-CM | POA: Diagnosis not present

## 2022-06-20 DIAGNOSIS — E785 Hyperlipidemia, unspecified: Secondary | ICD-10-CM | POA: Diagnosis not present

## 2022-06-20 DIAGNOSIS — I872 Venous insufficiency (chronic) (peripheral): Secondary | ICD-10-CM | POA: Diagnosis not present

## 2022-06-20 DIAGNOSIS — E291 Testicular hypofunction: Secondary | ICD-10-CM | POA: Diagnosis not present

## 2022-06-20 DIAGNOSIS — K5900 Constipation, unspecified: Secondary | ICD-10-CM | POA: Diagnosis not present

## 2022-06-21 DIAGNOSIS — H5203 Hypermetropia, bilateral: Secondary | ICD-10-CM | POA: Diagnosis not present

## 2022-06-26 DIAGNOSIS — Z0100 Encounter for examination of eyes and vision without abnormal findings: Secondary | ICD-10-CM | POA: Diagnosis not present

## 2022-08-31 IMAGING — MR MR LUMBAR SPINE WO/W CM
4 of 7 series · 23 of 48 positions shown · IV contrast (20ml Multihance)
Comparison: None.

CLINICAL DATA: Severe right flank pain that extends to the lateral
leg, affecting the knee.

EXAM:
MRI LUMBAR SPINE WITHOUT AND WITH CONTRAST
TECHNIQUE: Multiplanar and multiecho pulse sequences of the lumbar spine were
obtained without and with intravenous contrast.
CONTRAST:  20mL MULTIHANCE GADOBENATE DIMEGLUMINE 529 MG/ML IV SOLN

[Series 3: T1 · sagittal · 4.0mm · 0.88mm/px · 3 of 16 slices shown (1 of 2)]
[im 1/16]
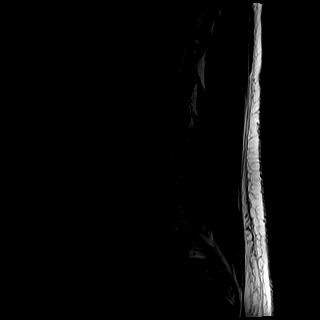
[im 8/16]
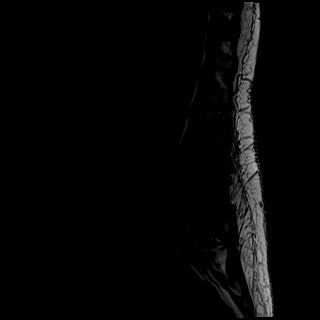
[im 16/16]
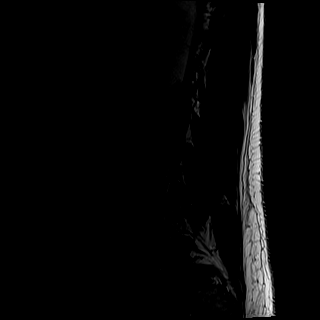

[Series 5: T2 · axial · 4.0mm · 0.39mm/px · z∈[-49,+162]mm · 11 of 42 slices shown (1 of 2)]
[im 1/42]
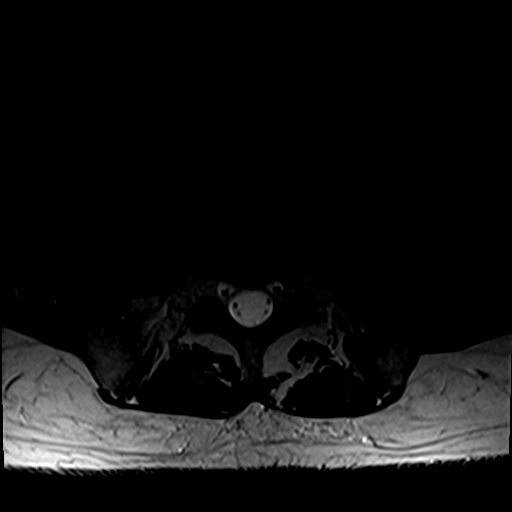
[im 5/42]
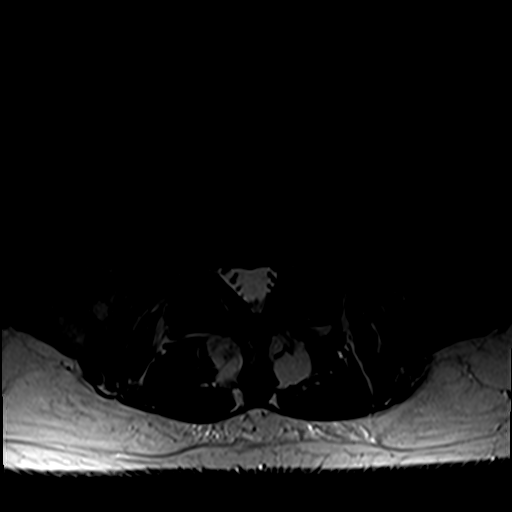
[im 9/42]
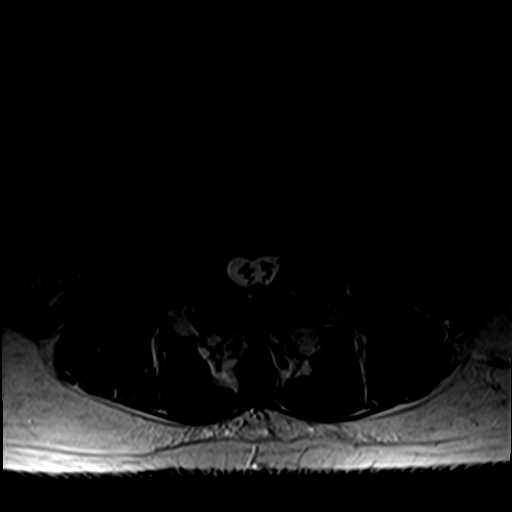
[im 13/42]
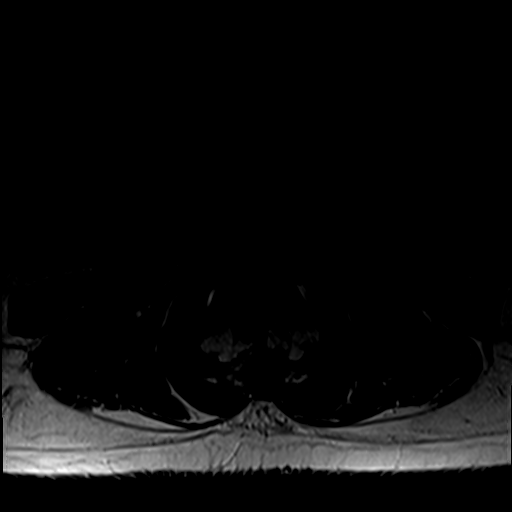
[im 17/42]
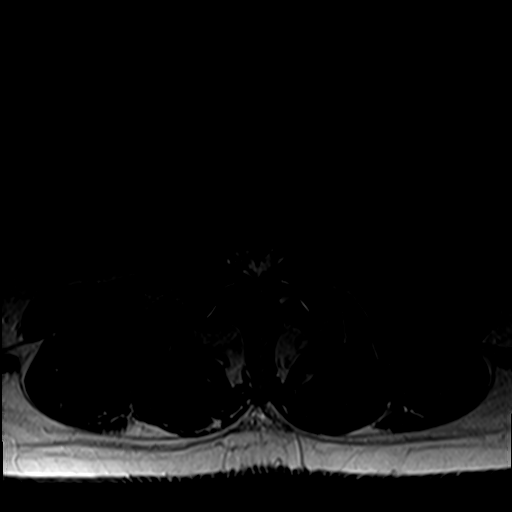
[im 21/42]
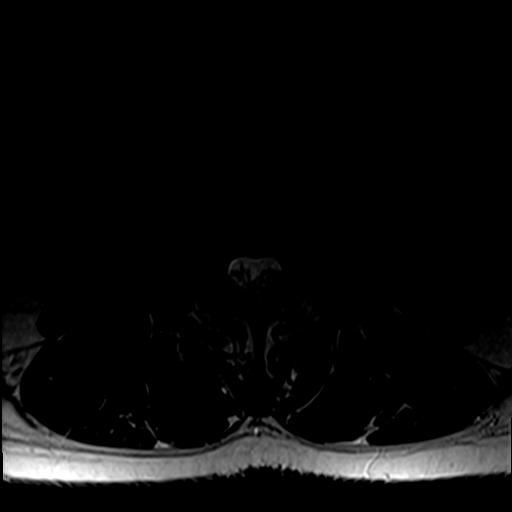
[im 25/42]
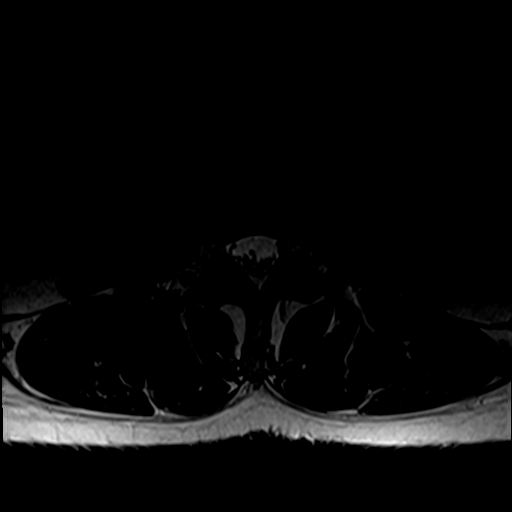
[im 29/42]
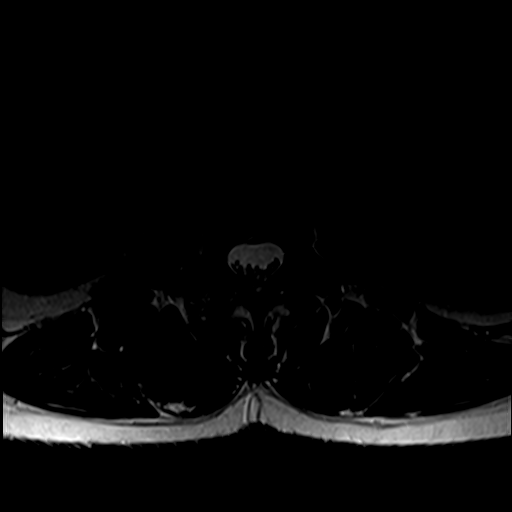
[im 33/42]
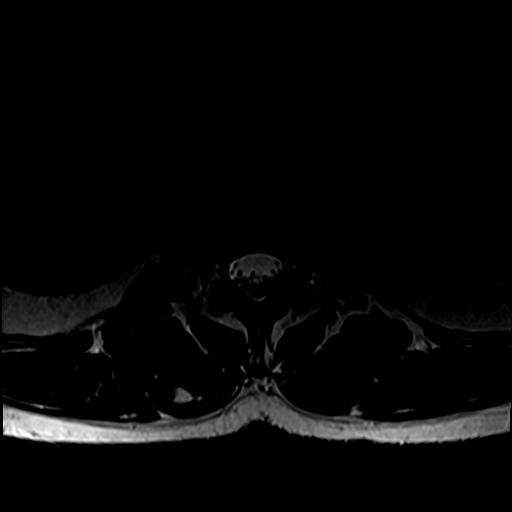
[im 37/42]
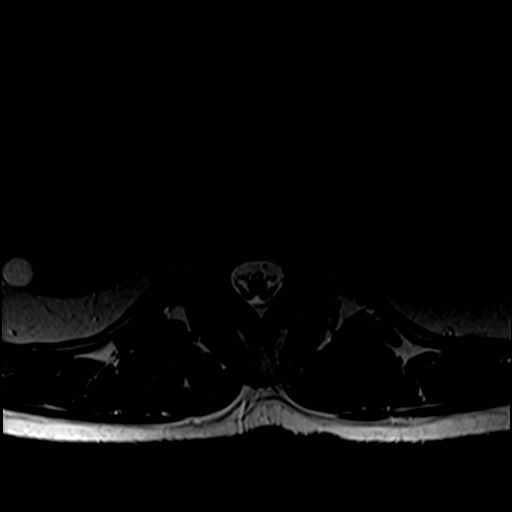
[im 42/42]
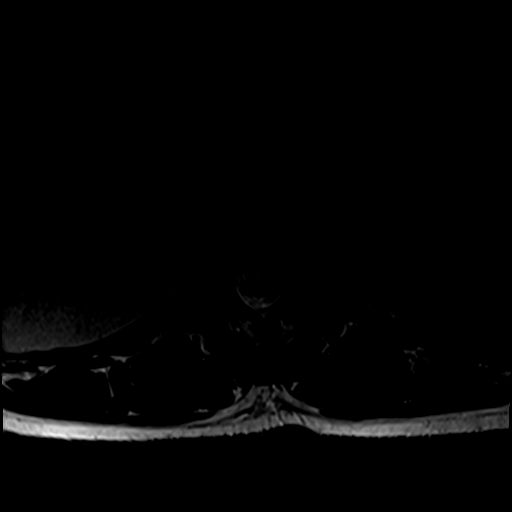

[Series 6: T1 · axial · 4.0mm · 0.39mm/px · z∈[-49,+138]mm · 5 of 42 slices shown (2 of 2)]
[im 1/42]
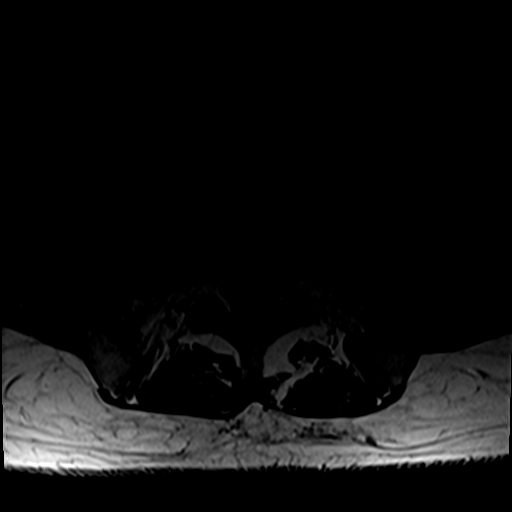
[im 5/42]
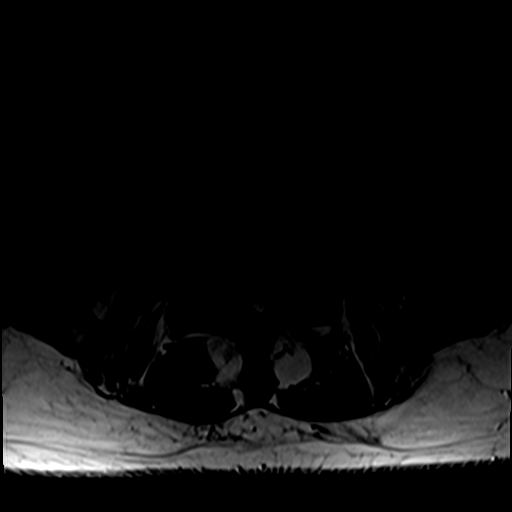
[im 9/42]
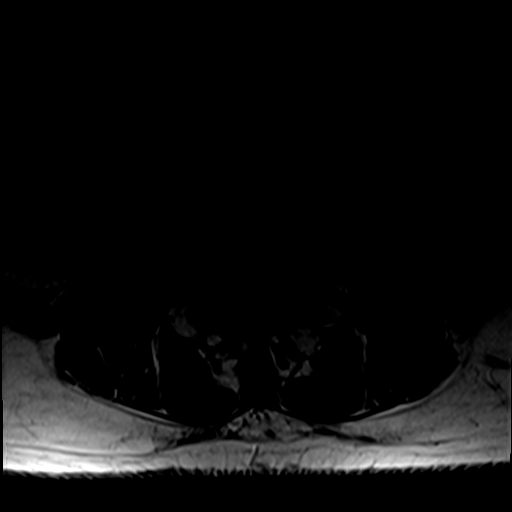
[im 21/42]
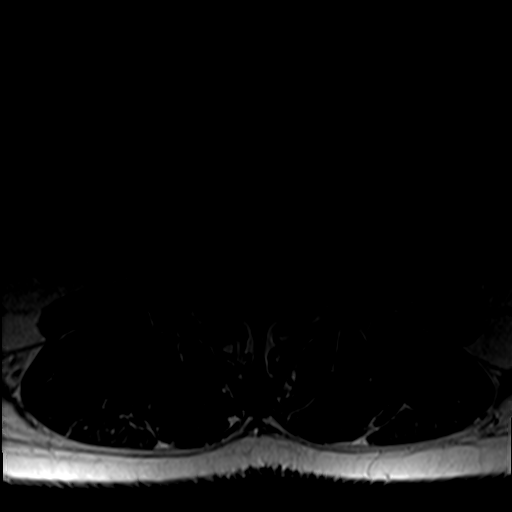
[im 37/42]
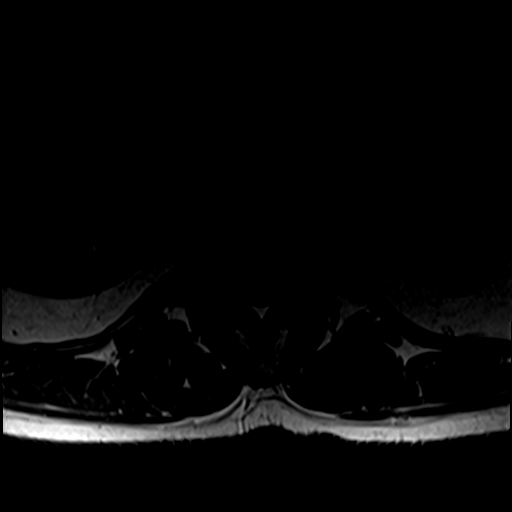

[Series 7: T2 · sagittal · 4.0mm · 1.09mm/px · 4 of 16 slices shown (2 of 2)]
[im 1/16]
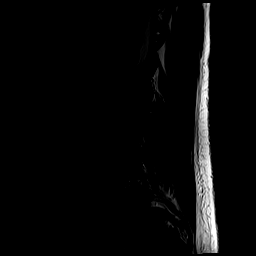
[im 6/16]
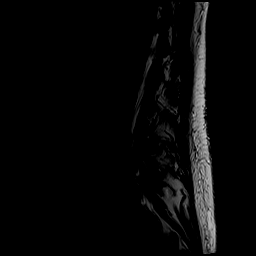
[im 11/16]
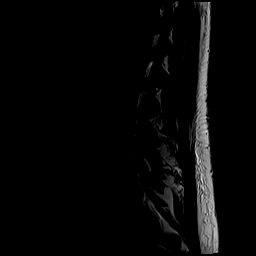
[im 16/16]
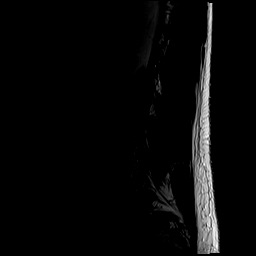

[23 of 48 positions shown; findings below may reference images not displayed]

FINDINGS: Segmentation: Transitional S1 vertebra with rudimentary S1-2 disc
space.

Alignment:  Grade 1 anterolisthesis at L4-5

Vertebrae:  No fracture, evidence of discitis, or bone lesion.

Conus medullaris and cauda equina: Conus extends to the L1 level.
Conus appears normal. There is cauda equina redundancy due to the
degree of severe spinal stenosis.

Paraspinal and other soft tissues: No evidence of perispinal mass or
inflammation.

Disc levels:

L3-L4: Degenerative facet spurring with ligamentum flavum
thickening. Mild spinal stenosis.

L4-L5: Advanced facet osteoarthritis with spurring, joint effusions,
and ligamentous thickening. Anterolisthesis. The disc is narrowed
and bulging with advanced and compressive spinal stenosis. Moderate
left foraminal narrowing

L5-S1:Disc narrowing and bulging.  Posterior annular fissure.
IMPRESSION: 1. Lumbar spine degeneration at L3-4 and below most significantly
affecting the facets, especially at L4-5 where there is
anterolisthesis and advanced spinal stenosis.
2. L4-5 moderate left foraminal narrowing.

## 2022-09-10 ENCOUNTER — Ambulatory Visit (HOSPITAL_COMMUNITY): Payer: Medicare HMO

## 2022-09-18 ENCOUNTER — Ambulatory Visit (HOSPITAL_COMMUNITY)
Admission: RE | Admit: 2022-09-18 | Discharge: 2022-09-18 | Disposition: A | Discharge status: Home / Self Care | Payer: Medicare HMO | Source: Ambulatory Visit | Attending: Cardiology | Admitting: Cardiology

## 2022-09-18 ENCOUNTER — Other Ambulatory Visit (HOSPITAL_COMMUNITY): Payer: Self-pay | Admitting: Endocrinology

## 2022-09-18 DIAGNOSIS — M79661 Pain in right lower leg: Secondary | ICD-10-CM

## 2022-09-18 DIAGNOSIS — M79662 Pain in left lower leg: Secondary | ICD-10-CM | POA: Diagnosis not present

## 2022-10-29 ENCOUNTER — Other Ambulatory Visit: Payer: Self-pay | Admitting: *Deleted

## 2022-10-29 DIAGNOSIS — M7989 Other specified soft tissue disorders: Secondary | ICD-10-CM

## 2022-10-29 DIAGNOSIS — I872 Venous insufficiency (chronic) (peripheral): Secondary | ICD-10-CM

## 2022-10-31 DIAGNOSIS — I872 Venous insufficiency (chronic) (peripheral): Secondary | ICD-10-CM | POA: Diagnosis not present

## 2022-10-31 DIAGNOSIS — E559 Vitamin D deficiency, unspecified: Secondary | ICD-10-CM | POA: Diagnosis not present

## 2022-10-31 DIAGNOSIS — K5900 Constipation, unspecified: Secondary | ICD-10-CM | POA: Diagnosis not present

## 2022-10-31 DIAGNOSIS — Z794 Long term (current) use of insulin: Secondary | ICD-10-CM | POA: Diagnosis not present

## 2022-10-31 DIAGNOSIS — N401 Enlarged prostate with lower urinary tract symptoms: Secondary | ICD-10-CM | POA: Diagnosis not present

## 2022-10-31 DIAGNOSIS — E1142 Type 2 diabetes mellitus with diabetic polyneuropathy: Secondary | ICD-10-CM | POA: Diagnosis not present

## 2022-10-31 DIAGNOSIS — E291 Testicular hypofunction: Secondary | ICD-10-CM | POA: Diagnosis not present

## 2022-10-31 DIAGNOSIS — M4716 Other spondylosis with myelopathy, lumbar region: Secondary | ICD-10-CM | POA: Diagnosis not present

## 2022-10-31 DIAGNOSIS — E785 Hyperlipidemia, unspecified: Secondary | ICD-10-CM | POA: Diagnosis not present

## 2022-11-19 ENCOUNTER — Ambulatory Visit (HOSPITAL_COMMUNITY): Payer: Medicare HMO

## 2022-11-19 ENCOUNTER — Encounter: Payer: Medicare HMO | Admitting: Vascular Surgery

## 2022-11-25 DIAGNOSIS — M5441 Lumbago with sciatica, right side: Secondary | ICD-10-CM | POA: Diagnosis not present

## 2022-11-25 DIAGNOSIS — M545 Low back pain, unspecified: Secondary | ICD-10-CM | POA: Diagnosis not present

## 2022-11-25 DIAGNOSIS — M791 Myalgia, unspecified site: Secondary | ICD-10-CM | POA: Diagnosis not present

## 2022-11-28 ENCOUNTER — Encounter: Payer: Medicare HMO | Admitting: Vascular Surgery

## 2022-11-28 ENCOUNTER — Encounter (HOSPITAL_COMMUNITY): Payer: Medicare HMO

## 2022-12-18 DIAGNOSIS — M79661 Pain in right lower leg: Secondary | ICD-10-CM | POA: Diagnosis not present

## 2022-12-18 DIAGNOSIS — M25551 Pain in right hip: Secondary | ICD-10-CM | POA: Diagnosis not present

## 2022-12-18 DIAGNOSIS — M545 Low back pain, unspecified: Secondary | ICD-10-CM | POA: Diagnosis not present

## 2023-02-05 DIAGNOSIS — M545 Low back pain, unspecified: Secondary | ICD-10-CM | POA: Diagnosis not present

## 2023-02-23 DIAGNOSIS — M5416 Radiculopathy, lumbar region: Secondary | ICD-10-CM | POA: Diagnosis not present

## 2023-03-05 DIAGNOSIS — M5416 Radiculopathy, lumbar region: Secondary | ICD-10-CM | POA: Diagnosis not present

## 2023-03-06 DIAGNOSIS — E559 Vitamin D deficiency, unspecified: Secondary | ICD-10-CM | POA: Diagnosis not present

## 2023-03-06 DIAGNOSIS — N401 Enlarged prostate with lower urinary tract symptoms: Secondary | ICD-10-CM | POA: Diagnosis not present

## 2023-03-06 DIAGNOSIS — I872 Venous insufficiency (chronic) (peripheral): Secondary | ICD-10-CM | POA: Diagnosis not present

## 2023-03-06 DIAGNOSIS — Z794 Long term (current) use of insulin: Secondary | ICD-10-CM | POA: Diagnosis not present

## 2023-03-06 DIAGNOSIS — E1142 Type 2 diabetes mellitus with diabetic polyneuropathy: Secondary | ICD-10-CM | POA: Diagnosis not present

## 2023-03-06 DIAGNOSIS — E785 Hyperlipidemia, unspecified: Secondary | ICD-10-CM | POA: Diagnosis not present

## 2023-03-06 DIAGNOSIS — Z23 Encounter for immunization: Secondary | ICD-10-CM | POA: Diagnosis not present

## 2023-03-06 DIAGNOSIS — M4716 Other spondylosis with myelopathy, lumbar region: Secondary | ICD-10-CM | POA: Diagnosis not present

## 2023-03-19 DIAGNOSIS — M545 Low back pain, unspecified: Secondary | ICD-10-CM | POA: Diagnosis not present

## 2023-04-01 DIAGNOSIS — E1142 Type 2 diabetes mellitus with diabetic polyneuropathy: Secondary | ICD-10-CM | POA: Diagnosis not present

## 2023-04-01 DIAGNOSIS — I872 Venous insufficiency (chronic) (peripheral): Secondary | ICD-10-CM | POA: Diagnosis not present

## 2023-04-01 DIAGNOSIS — R030 Elevated blood-pressure reading, without diagnosis of hypertension: Secondary | ICD-10-CM | POA: Diagnosis not present

## 2023-04-01 DIAGNOSIS — M79661 Pain in right lower leg: Secondary | ICD-10-CM | POA: Diagnosis not present

## 2023-04-01 DIAGNOSIS — R600 Localized edema: Secondary | ICD-10-CM | POA: Diagnosis not present

## 2023-07-29 DIAGNOSIS — E113311 Type 2 diabetes mellitus with moderate nonproliferative diabetic retinopathy with macular edema, right eye: Secondary | ICD-10-CM | POA: Diagnosis not present

## 2023-08-19 DIAGNOSIS — E1142 Type 2 diabetes mellitus with diabetic polyneuropathy: Secondary | ICD-10-CM | POA: Diagnosis not present

## 2023-08-19 DIAGNOSIS — Z794 Long term (current) use of insulin: Secondary | ICD-10-CM | POA: Diagnosis not present

## 2023-08-19 DIAGNOSIS — E785 Hyperlipidemia, unspecified: Secondary | ICD-10-CM | POA: Diagnosis not present

## 2023-08-26 DIAGNOSIS — E113311 Type 2 diabetes mellitus with moderate nonproliferative diabetic retinopathy with macular edema, right eye: Secondary | ICD-10-CM | POA: Diagnosis not present

## 2023-10-01 DIAGNOSIS — Z794 Long term (current) use of insulin: Secondary | ICD-10-CM | POA: Diagnosis not present

## 2023-10-01 DIAGNOSIS — E1142 Type 2 diabetes mellitus with diabetic polyneuropathy: Secondary | ICD-10-CM | POA: Diagnosis not present

## 2023-10-01 DIAGNOSIS — M4716 Other spondylosis with myelopathy, lumbar region: Secondary | ICD-10-CM | POA: Diagnosis not present

## 2023-10-01 DIAGNOSIS — I739 Peripheral vascular disease, unspecified: Secondary | ICD-10-CM | POA: Diagnosis not present

## 2023-10-01 DIAGNOSIS — I872 Venous insufficiency (chronic) (peripheral): Secondary | ICD-10-CM | POA: Diagnosis not present

## 2023-10-01 DIAGNOSIS — E785 Hyperlipidemia, unspecified: Secondary | ICD-10-CM | POA: Diagnosis not present

## 2023-10-01 DIAGNOSIS — N401 Enlarged prostate with lower urinary tract symptoms: Secondary | ICD-10-CM | POA: Diagnosis not present

## 2023-10-01 DIAGNOSIS — E11311 Type 2 diabetes mellitus with unspecified diabetic retinopathy with macular edema: Secondary | ICD-10-CM | POA: Diagnosis not present

## 2023-10-08 DIAGNOSIS — E113311 Type 2 diabetes mellitus with moderate nonproliferative diabetic retinopathy with macular edema, right eye: Secondary | ICD-10-CM | POA: Diagnosis not present

## 2023-10-12 ENCOUNTER — Other Ambulatory Visit (HOSPITAL_COMMUNITY): Payer: Self-pay | Admitting: Endocrinology

## 2023-10-12 DIAGNOSIS — R600 Localized edema: Secondary | ICD-10-CM

## 2023-10-29 ENCOUNTER — Ambulatory Visit (HOSPITAL_COMMUNITY)
Admission: RE | Admit: 2023-10-29 | Discharge: 2023-10-29 | Disposition: A | Discharge status: Home / Self Care | Source: Ambulatory Visit | Attending: Endocrinology | Admitting: Endocrinology

## 2023-10-29 DIAGNOSIS — I351 Nonrheumatic aortic (valve) insufficiency: Secondary | ICD-10-CM | POA: Insufficient documentation

## 2023-10-29 DIAGNOSIS — R600 Localized edema: Secondary | ICD-10-CM | POA: Insufficient documentation

## 2023-10-29 LAB — ECHOCARDIOGRAM COMPLETE
Area-P 1/2: 2.55 cm2
Calc EF: 67.5 %
S' Lateral: 3.1 cm
Single Plane A2C EF: 68.8 %
Single Plane A4C EF: 69.1 %

## 2023-11-02 ENCOUNTER — Other Ambulatory Visit: Payer: Self-pay

## 2023-11-02 ENCOUNTER — Emergency Department (HOSPITAL_COMMUNITY)
Admission: EM | Admit: 2023-11-02 | Discharge: 2023-11-02 | Disposition: A | Discharge status: Home / Self Care | Attending: Emergency Medicine | Admitting: Emergency Medicine

## 2023-11-02 ENCOUNTER — Encounter (HOSPITAL_COMMUNITY): Payer: Self-pay

## 2023-11-02 DIAGNOSIS — Z7982 Long term (current) use of aspirin: Secondary | ICD-10-CM | POA: Diagnosis not present

## 2023-11-02 DIAGNOSIS — T679XXA Effect of heat and light, unspecified, initial encounter: Secondary | ICD-10-CM | POA: Insufficient documentation

## 2023-11-02 DIAGNOSIS — Z794 Long term (current) use of insulin: Secondary | ICD-10-CM | POA: Insufficient documentation

## 2023-11-02 DIAGNOSIS — R531 Weakness: Secondary | ICD-10-CM | POA: Diagnosis not present

## 2023-11-02 DIAGNOSIS — R55 Syncope and collapse: Secondary | ICD-10-CM | POA: Diagnosis not present

## 2023-11-02 DIAGNOSIS — W19XXXA Unspecified fall, initial encounter: Secondary | ICD-10-CM | POA: Diagnosis not present

## 2023-11-02 DIAGNOSIS — S80211A Abrasion, right knee, initial encounter: Secondary | ICD-10-CM | POA: Diagnosis not present

## 2023-11-02 DIAGNOSIS — T675XXA Heat exhaustion, unspecified, initial encounter: Secondary | ICD-10-CM | POA: Diagnosis not present

## 2023-11-02 DIAGNOSIS — R000 Tachycardia, unspecified: Secondary | ICD-10-CM | POA: Diagnosis not present

## 2023-11-02 DIAGNOSIS — M6281 Muscle weakness (generalized): Secondary | ICD-10-CM | POA: Diagnosis not present

## 2023-11-02 LAB — URINALYSIS, ROUTINE W REFLEX MICROSCOPIC
Bilirubin Urine: NEGATIVE
Glucose, UA: NEGATIVE mg/dL
Hgb urine dipstick: NEGATIVE
Ketones, ur: NEGATIVE mg/dL
Leukocytes,Ua: NEGATIVE
Nitrite: NEGATIVE
Protein, ur: NEGATIVE mg/dL
Specific Gravity, Urine: 1.011 (ref 1.005–1.030)
pH: 5 (ref 5.0–8.0)

## 2023-11-02 LAB — COMPREHENSIVE METABOLIC PANEL WITH GFR
ALT: 12 U/L (ref 0–44)
AST: 22 U/L (ref 15–41)
Albumin: 3.9 g/dL (ref 3.5–5.0)
Alkaline Phosphatase: 49 U/L (ref 38–126)
Anion gap: 9 (ref 5–15)
BUN: 16 mg/dL (ref 8–23)
CO2: 24 mmol/L (ref 22–32)
Calcium: 9.6 mg/dL (ref 8.9–10.3)
Chloride: 105 mmol/L (ref 98–111)
Creatinine, Ser: 1.2 mg/dL (ref 0.61–1.24)
GFR, Estimated: >60 mL/min (ref >60)
Glucose, Bld: 177 mg/dL — ABNORMAL HIGH (ref 70–99)
Potassium: 3.7 mmol/L (ref 3.5–5.1)
Sodium: 138 mmol/L (ref 135–145)
Total Bilirubin: 0.9 mg/dL (ref 0.0–1.2)
Total Protein: 6.6 g/dL (ref 6.5–8.1)

## 2023-11-02 LAB — CBC
HCT: 37.4 % — ABNORMAL LOW (ref 39.0–52.0)
Hemoglobin: 12.4 g/dL — ABNORMAL LOW (ref 13.0–17.0)
MCH: 29 pg (ref 26.0–34.0)
MCHC: 33.2 g/dL (ref 30.0–36.0)
MCV: 87.6 fL (ref 80.0–100.0)
Platelets: 151 K/uL (ref 150–400)
RBC: 4.27 MIL/uL (ref 4.22–5.81)
RDW: 12.2 % (ref 11.5–15.5)
WBC: 3.9 K/uL — ABNORMAL LOW (ref 4.0–10.5)
nRBC: 0 % (ref 0.0–0.2)

## 2023-11-02 LAB — CK: Total CK: 223 U/L (ref 49–397)

## 2023-11-02 LAB — CBG MONITORING, ED: Glucose-Capillary: 171 mg/dL — ABNORMAL HIGH (ref 70–99)

## 2023-11-02 NOTE — ED Provider Notes (Signed)
 [place] EMERGENCY DEPARTMENT AT [facility] Provider Note   CSN: [id number] Arrival date & time: 11/02/23  1351     Patient presents with: Heat Exhaustion  and Near Syncopal Episode   Derrick Ward is a 78 y.o. male.   HPI 78 year old male presents with weakness after being out in the heat.  He states he was doing more yard work then he thought he probably should and when he was going inside and trying to latch the door he fell forward and lost his balance as he was bending over.  No syncope or injury besides some mild knee abrasion.  He has chronic weakness in his right leg that has not had an obvious diagnosis.  He states that due to this he was unable to get up.  He was given some fluids with EMS and feels better.  Right now he feels asymptomatic and is freely moving his right lower extremity and states it feels normal.  No headaches or head injuries.  No chest pain today.  Prior to Admission medications   Medication Sig Start Date End Date Taking? Authorizing Provider  Alpha-D-Galactosidase (BEANO PO) Take by mouth as needed.    [provider]  aspirin 81 MG chewable tablet Chew by mouth daily.    [provider]  Cyanocobalamin (VITAMIN B 12 PO) Take by mouth as needed.    [provider]  insulin detemir (LEVEMIR) 100 UNIT/ML injection Inject 30 Units into the skin at bedtime.    [provider]  linaGLIPtin-metFORMIN HCl 2.08-998 MG TABS Take 1 capsule by mouth 2 (two) times daily before a meal.    [provider]  Multiple Vitamin (MULTIVITAMIN) tablet Take 1 tablet by mouth daily.    [provider]    Allergies: Androgel  Holger.Harold ]    Review of Systems  Respiratory:  Negative for shortness of breath.   Cardiovascular:  Negative for chest pain.  Musculoskeletal:  Negative for arthralgias.  Neurological:  Positive for weakness. Negative for syncope, light-headedness and headaches.    Updated Vital  Signs BP 134/69 (BP Location: Right Arm)   Pulse 67   Temp 97.8 F (36.6 C) (Oral)   Resp 18   Ht 5' 9 (1.753 m)   Wt 81.6 kg   SpO2 99%   BMI 26.58 kg/m   Physical Exam Vitals and nursing note reviewed.  Constitutional:      Appearance: He is well-developed.  HENT:     Head: Normocephalic and atraumatic.  Cardiovascular:     Rate and Rhythm: Normal rate and regular rhythm.     Pulses:          Dorsalis pedis pulses are 2+ on the right side.     Heart sounds: Normal heart sounds.  Pulmonary:     Effort: Pulmonary effort is normal.     Breath sounds: Normal breath sounds.  Abdominal:     Palpations: Abdomen is soft.     Tenderness: There is no abdominal tenderness.  Musculoskeletal:     Right knee: Normal range of motion. No tenderness.     Comments: Mild abrasions over the right knee but no significant tenderness.  Skin:    General: Skin is warm and dry.  Neurological:     Mental Status: He is alert.     Comments: CN 3-12 grossly intact. 5/5 strength in all 4 extremities. Grossly normal sensation. Normal finger to nose. Able to ambulate, typically uses a cane.     (  all labs ordered are listed, but only abnormal results are displayed) Labs Reviewed  COMPREHENSIVE METABOLIC PANEL WITH GFR - Abnormal; Notable for the following components:      Result Value   Glucose, Bld 177 (*)    All other components within normal limits  CBC - Abnormal; Notable for the following components:   WBC 3.9 (*)    Hemoglobin 12.4 (*)    HCT 37.4 (*)    All other components within normal limits  URINALYSIS, ROUTINE W REFLEX MICROSCOPIC - Abnormal; Notable for the following components:   Color, Urine STRAW (*)    All other components within normal limits  CBG MONITORING, ED - Abnormal; Notable for the following components:   Glucose-Capillary 171 (*)    All other components within normal limits  CK    EKG: EKG Interpretation Date/Time:  Monday November 02 2023 17:56:03  EDT Ventricular Rate:  56 PR Interval:  162 QRS Duration:  95 QT Interval:  418 QTC Calculation: 404 R Axis:   -17  Text Interpretation: Sinus rhythm Borderline left axis deviation Abnormal R-wave progression, early transition no acute ST/T changes No old tracing to compare Confirmed by Freddi Hamilton [phone]) on 11/02/2023 5:58:32 PM  Radiology: No results found.   Procedures   Medications Ordered in the ED - No data to display                                  Medical Decision Making Amount and/or Complexity of Data Reviewed Labs: ordered.    Details: Mild hyperglycemia.  No AKI. ECG/medicine tests: ordered and independent interpretation performed.    Details: No acute ischemia   Patient feels a lot better and is back to normal.  This was likely heat related illness.  However he feels fine now, has normal vital signs, and no acute symptoms.  Never had any chest pain or syncope.  I do not think further workup is needed and for this leg weakness, it sounds like a chronic problem and he will need continued outpatient workup for what has been years of right leg weakness.  No signs or symptoms of a stroke today.  Discharged home with return precautions.     Final diagnoses:  Heat effect, initial encounter    ED Discharge Orders     None          Freddi Hamilton, MD 11/02/23 1801

## 2023-11-02 NOTE — Discharge Instructions (Signed)
 Be sure to stay out of the heat and drink plenty of fluids.  Follow-up with your primary care physician.  If you develop new or worsening symptoms, chest pain, shortness of breath, weakness, or any other new/concerning symptoms then return to the ER.

## 2023-11-02 NOTE — ED Provider Triage Note (Signed)
 Emergency Medicine Provider Triage Evaluation Note  Derrick Ward , a 78 y.o. male  was evaluated in triage.  Pt complains of heat exhaustion patient states he was working outside today.SABRA  Pose bed that he is inside.  Review of Systems  Positive: Felt tired Negative: No neurological features  Physical Exam  BP 134/69 (BP Location: Right Arm)   Pulse 67   Temp 97.8 F (36.6 C) (Oral)   Resp 18   Ht 1.753 m (5' 9)   Wt 81.6 kg   SpO2 99%   BMI 26.58 kg/m  Gen:   Awake, no distress   Resp:  Normal effort  MSK:   Moves extremities without difficulty  Other:    Medical Decision Making  Medically screening exam initiated at 2:35 PM.  Appropriate orders placed.  Derrick Ward was informed that the remainder of the evaluation will be completed by another provider, this initial triage assessment does not replace that evaluation, and the importance of remaining in the ED until their evaluation is complete.     Dasie Faden, MD 11/02/23 [phone]

## 2023-11-02 NOTE — ED Triage Notes (Signed)
 Pt BIB [facility] from home d/t a near syncopal episode & feeling weak after doing yard work outside for the past 4 hrs. EMS reports he has had these heat exhaustion s/s before in the past & after sitting down to avoid falling when he felt faint he was too weak to get up so he came in for eval. A/Ox4, 124/68, 76 bpm, resp 18, 95% on RA, CBG 191 & received 550cc LR via 18g Lt AC.

## 2023-11-05 DIAGNOSIS — E113311 Type 2 diabetes mellitus with moderate nonproliferative diabetic retinopathy with macular edema, right eye: Secondary | ICD-10-CM | POA: Diagnosis not present

## 2023-12-08 DIAGNOSIS — E113311 Type 2 diabetes mellitus with moderate nonproliferative diabetic retinopathy with macular edema, right eye: Secondary | ICD-10-CM | POA: Diagnosis not present

## 2023-12-23 DIAGNOSIS — E113311 Type 2 diabetes mellitus with moderate nonproliferative diabetic retinopathy with macular edema, right eye: Secondary | ICD-10-CM | POA: Diagnosis not present

## 2024-01-26 DIAGNOSIS — E785 Hyperlipidemia, unspecified: Secondary | ICD-10-CM | POA: Diagnosis not present

## 2024-01-26 DIAGNOSIS — M1990 Unspecified osteoarthritis, unspecified site: Secondary | ICD-10-CM | POA: Diagnosis not present

## 2024-01-26 DIAGNOSIS — I10 Essential (primary) hypertension: Secondary | ICD-10-CM | POA: Diagnosis not present

## 2024-01-26 DIAGNOSIS — I872 Venous insufficiency (chronic) (peripheral): Secondary | ICD-10-CM | POA: Diagnosis not present

## 2024-01-26 DIAGNOSIS — Z9989 Dependence on other enabling machines and devices: Secondary | ICD-10-CM | POA: Diagnosis not present

## 2024-01-26 DIAGNOSIS — Z59869 Financial insecurity, unspecified: Secondary | ICD-10-CM | POA: Diagnosis not present

## 2024-01-26 DIAGNOSIS — N400 Enlarged prostate without lower urinary tract symptoms: Secondary | ICD-10-CM | POA: Diagnosis not present

## 2024-01-26 DIAGNOSIS — K5900 Constipation, unspecified: Secondary | ICD-10-CM | POA: Diagnosis not present

## 2024-01-26 DIAGNOSIS — I359 Nonrheumatic aortic valve disorder, unspecified: Secondary | ICD-10-CM | POA: Diagnosis not present

## 2024-01-26 DIAGNOSIS — M5440 Lumbago with sciatica, unspecified side: Secondary | ICD-10-CM | POA: Diagnosis not present

## 2024-01-26 DIAGNOSIS — E1142 Type 2 diabetes mellitus with diabetic polyneuropathy: Secondary | ICD-10-CM | POA: Diagnosis not present

## 2024-01-26 DIAGNOSIS — Z794 Long term (current) use of insulin: Secondary | ICD-10-CM | POA: Diagnosis not present

## 2024-01-26 DIAGNOSIS — E1151 Type 2 diabetes mellitus with diabetic peripheral angiopathy without gangrene: Secondary | ICD-10-CM | POA: Diagnosis not present

## 2024-02-03 DIAGNOSIS — E1142 Type 2 diabetes mellitus with diabetic polyneuropathy: Secondary | ICD-10-CM | POA: Diagnosis not present

## 2024-02-03 DIAGNOSIS — N401 Enlarged prostate with lower urinary tract symptoms: Secondary | ICD-10-CM | POA: Diagnosis not present

## 2024-02-03 DIAGNOSIS — Z794 Long term (current) use of insulin: Secondary | ICD-10-CM | POA: Diagnosis not present

## 2024-02-03 DIAGNOSIS — I872 Venous insufficiency (chronic) (peripheral): Secondary | ICD-10-CM | POA: Diagnosis not present

## 2024-02-03 DIAGNOSIS — E785 Hyperlipidemia, unspecified: Secondary | ICD-10-CM | POA: Diagnosis not present

## 2024-02-03 DIAGNOSIS — Z23 Encounter for immunization: Secondary | ICD-10-CM | POA: Diagnosis not present

## 2024-02-03 DIAGNOSIS — R600 Localized edema: Secondary | ICD-10-CM | POA: Diagnosis not present

## 2024-02-03 DIAGNOSIS — E11311 Type 2 diabetes mellitus with unspecified diabetic retinopathy with macular edema: Secondary | ICD-10-CM | POA: Diagnosis not present

## 2024-02-09 ENCOUNTER — Encounter: Payer: Self-pay | Admitting: Orthopedic Surgery

## 2024-02-09 ENCOUNTER — Ambulatory Visit: Admitting: Orthopedic Surgery

## 2024-02-09 DIAGNOSIS — I872 Venous insufficiency (chronic) (peripheral): Secondary | ICD-10-CM | POA: Diagnosis not present

## 2024-02-09 NOTE — Progress Notes (Addendum)
 Office Visit Note   Patient: Derrick Ward           Date of Birth: 04-Feb-1946           MRN: [id number] Visit Date: 02/09/2024              Requested by: Nichole Senior, MD [address] [place],  [place] [address] PCP: Pcp, No  Chief Complaint  Patient presents with   Right Leg - Follow-up      HPI: Discussed the use of AI scribe software for clinical note transcription with the patient, who gave verbal consent to proceed.  History of Present Illness Derrick Ward is a 78 year old male who presents with bilateral leg swelling.  He has experienced bilateral leg swelling, which initially began in one leg before affecting the other. Despite taking diuretics daily for three days and then every three days, the swelling persists, although the medication causes frequent urination.  He recalls a past leg injury at age [age] and a hernia operation in his twenties, but does not believe these are related to his current symptoms. He previously consulted with a vein specialist but found the consultation unhelpful.  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 His mother advised him to elevate his legs, which he acknowledges he did not consistently follow. He recalls an incident where his mother treated a significant knee injury, which was later confirmed by a doctor to have been serious.  He wears compression socks, which he believes are tight, but questions their effectiveness as they are not specifically designed for compression therapy. His daughter purchased these socks for him.  Patient has been wearing knee-high compression socks for over a month without relief.     Assessment & Plan: Visit Diagnoses:  1. Venous insufficiency (chronic) (peripheral)     Plan: Assessment and Plan Assessment & Plan Chronic venous insufficiency with right lower extremity edema Chronic venous insufficiency with edema due to venous  valve incompetence. Good arterial circulation confirmed. Previous non-compression socks ineffective. - Recommend daily use of 15-20 mmHg compression socks. - Advise exercise to promote muscle contraction and fluid movement. - Instruct to elevate legs to assist in fluid return. - Advise follow-up if symptoms persist or worsen.  Abrasion of right lower leg Small abrasion over the tibia. No venous ulcers. - Advise applying compression sock over the abrasion.  Lymphedema bilateral lower extremities.  Will apply for authorization for lymphedema pumps.      Follow-Up Instructions: Return if symptoms worsen or fail to improve.   Ortho Exam  Patient is alert, oriented, no adenopathy, well-dressed, normal affect, normal respiratory effort. Physical Exam CARDIOVASCULAR: Strong palpable pulse in right lower extremity. EXTREMITIES: Pitting edema in right lower extremity. Right calf circumference 39 cm.  Patient's ankle calf and knee all have the same circumference secondary to the lymphedema.   SKIN: Small abrasion over right tibia, no venous ulcers.      Imaging: No results found. No images are attached to the encounter.  Labs: No results found for: HGBA1C, ESRSEDRATE, CRP, LABURIC, REPTSTATUS, GRAMSTAIN, CULT, LABORGA   Lab Results  Component Value Date   ALBUMIN 3.9 11/02/2023    No results found for: MG No results found for: VD25OH  No results found for: PREALBUMIN    Latest Ref Rng & Units 11/02/2023    2:02 PM  CBC EXTENDED  WBC 4.0 - 10.5 K/uL 3.9   RBC 4.22 -  5.81 MIL/uL 4.27   Hemoglobin 13.0 - 17.0 g/dL 87.5   HCT 60.9 - 47.9 % 37.4   Platelets 150 - 400 K/uL 151      There is no height or weight on file to calculate BMI.  Orders:  No orders of the defined types were placed in this encounter.  No orders of the defined types were placed in this encounter.    Procedures: No procedures performed  Clinical Data: No additional  findings.  ROS:  All other systems negative, except as noted in the HPI. Review of Systems  Objective: Vital Signs: There were no vitals taken for this visit.  Specialty Comments:  No specialty comments available.  PMFS History: Patient Active Problem List   Diagnosis Date Noted   Cardiovascular symptoms 03/22/2021   Lumbar spondylosis with myelopathy 03/22/2021   Skin sensation disturbance 03/22/2021   Vitamin D deficiency 03/22/2021   Encounter for general adult medical examination without abnormal findings 02/10/2019   Benign prostatic hyperplasia with lower urinary tract symptoms 10/11/2018   Polyneuropathy due to type 2 diabetes mellitus (HCC) 09/09/2017   Long term (current) use of insulin (HCC) 05/12/2017   Constipation 01/28/2013   Peripheral venous insufficiency 10/11/2012   Hyperlipidemia 11/05/2011   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   Past Medical History:  Diagnosis Date   Cataract    very small   Diabetes mellitus without complication (HCC)    Hyperlipidemia    Neuromuscular disorder (HCC)    hx neuropathy   Venous insufficiency     Family History  Problem Relation Age of Onset   Colon cancer Neg Hx    Colon polyps Neg Hx    Diabetes Father     Past Surgical History:  Procedure Laterality Date   CARDIAC CATHETERIZATION     10 yr ago    INGUINAL HERNIA REPAIR  1976   Social History   Occupational History   Not on file  Tobacco Use   Smoking status: Former   Smokeless tobacco: Never  Substance and Sexual Activity   Alcohol use: No    Alcohol/week: 0.0 standard drinks of alcohol   Drug use: No   Sexual activity: Not on file

## 2024-02-10 ENCOUNTER — Telehealth: Payer: Self-pay

## 2024-02-10 NOTE — Telephone Encounter (Signed)
 I called and lm on vm for pt to advise that we ned to get a measurement of his leg in 30 days so that

## 2024-02-22 ENCOUNTER — Ambulatory Visit (INDEPENDENT_AMBULATORY_CARE_PROVIDER_SITE_OTHER): Admitting: Orthopedic Surgery

## 2024-02-22 DIAGNOSIS — I890 Lymphedema, not elsewhere classified: Secondary | ICD-10-CM | POA: Diagnosis not present

## 2024-02-22 DIAGNOSIS — I872 Venous insufficiency (chronic) (peripheral): Secondary | ICD-10-CM

## 2024-02-24 ENCOUNTER — Encounter: Payer: Self-pay | Admitting: Orthopedic Surgery

## 2024-02-24 NOTE — Progress Notes (Signed)
 Office Visit Note   Patient: Derrick Ward           Date of Birth: 1945-08-20           MRN: [id number] Visit Date: 02/22/2024              Requested by: No referring provider defined for this encounter. PCP: Pcp, No  Chief Complaint  Patient presents with   Right Leg - Follow-up   Left Leg - Follow-up      HPI: Discussed the use of AI scribe software for clinical note transcription with the patient, who gave verbal consent to proceed.  History of Present Illness Derrick Ward is a 78 year old male who presents with lymphedema management.  He attributes some of his issues to being overweight. He has been managing fluid retention with diuretics, which have resulted in significant diuresis and weight loss.  He underwent an MRI in 2022 to evaluate his condition. He mentions stepping on his hip, which he believes may have contributed to his current issues.  He takes diuretics, specifically water pills, which he has adjusted to one pill every three days. Despite the reduced frequency, he experiences frequent urination, which he describes as 'pissing like a racehorse'. He feels reassured that his kidneys are functioning well as a result of the diuresis.     Assessment & Plan: Visit Diagnoses: No diagnosis found.  Plan: Assessment and Plan Assessment & Plan Bilateral lower extremity and truncal pitting edema Chronic edema with effective diuresis on current regimen, indicating good renal function. Adjusted diuretic frequency to manage fluid retention. - Submitted information for lymphedema pumps. - Coordinated with lymphedema pump providers for pump acquisition.      Follow-Up Instructions: No follow-ups on file.   Ortho Exam  Patient is alert, oriented, no adenopathy, well-dressed, normal affect, normal respiratory effort. Physical Exam EXTREMITIES: Pitting edema of the right and left lower extremities. Truncal edema present. Right calf circumference 30 cm, right  ankle circumference 31 cm. Left calf circumference 30 cm, left ankle circumference 31 cm. Palpable dorsalis pedis pulses bilaterally.      Imaging: No results found. No images are attached to the encounter.  Labs: No results found for: HGBA1C, ESRSEDRATE, CRP, LABURIC, REPTSTATUS, GRAMSTAIN, CULT, LABORGA   Lab Results  Component Value Date   ALBUMIN 3.9 11/02/2023    No results found for: MG No results found for: VD25OH  No results found for: PREALBUMIN    Latest Ref Rng & Units 11/02/2023    2:02 PM  CBC EXTENDED  WBC 4.0 - 10.5 K/uL 3.9   RBC 4.22 - 5.81 MIL/uL 4.27   Hemoglobin 13.0 - 17.0 g/dL 87.5   HCT 60.9 - 47.9 % 37.4   Platelets 150 - 400 K/uL 151      There is no height or weight on file to calculate BMI.  Orders:  No orders of the defined types were placed in this encounter.  No orders of the defined types were placed in this encounter.    Procedures: No procedures performed  Clinical Data: No additional findings.  ROS:  All other systems negative, except as noted in the HPI. Review of Systems  Objective: Vital Signs: There were no vitals taken for this visit.  Specialty Comments:  No specialty comments available.  PMFS History: Patient Active Problem List   Diagnosis Date Noted   Cardiovascular symptoms 03/22/2021   Lumbar spondylosis with myelopathy 03/22/2021   Skin sensation disturbance 03/22/2021  Vitamin D deficiency 03/22/2021   Encounter for general adult medical examination without abnormal findings 02/10/2019   Benign prostatic hyperplasia with lower urinary tract symptoms 10/11/2018   Polyneuropathy due to type 2 diabetes mellitus (HCC) 09/09/2017   Long term (current) use of insulin (HCC) 05/12/2017   Constipation 01/28/2013   Peripheral venous insufficiency 10/11/2012   Hyperlipidemia 11/05/2011   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   Past Medical History:  Diagnosis Date   Cataract    very  small   Diabetes mellitus without complication (HCC)    Hyperlipidemia    Neuromuscular disorder (HCC)    hx neuropathy   Venous insufficiency     Family History  Problem Relation Age of Onset   Colon cancer Neg Hx    Colon polyps Neg Hx    Diabetes Father     Past Surgical History:  Procedure Laterality Date   CARDIAC CATHETERIZATION     10 yr ago    INGUINAL HERNIA REPAIR  1976   Social History   Occupational History   Not on file  Tobacco Use   Smoking status: Former   Smokeless tobacco: Never  Substance and Sexual Activity   Alcohol use: No    Alcohol/week: 0.0 standard drinks of alcohol   Drug use: No   Sexual activity: Not on file

## 2024-03-08 ENCOUNTER — Ambulatory Visit: Admitting: Orthopedic Surgery
